# Patient Record
Sex: Female | Born: 1955 | Race: Black or African American | Hispanic: No | Marital: Single | Smoking: Never smoker
Health system: Southern US, Community
[De-identification: ages and names within clinical notes are randomized; demographics above are authoritative.]

## PROBLEM LIST (undated history)

## (undated) DIAGNOSIS — J45909 Unspecified asthma, uncomplicated: Secondary | ICD-10-CM

## (undated) DIAGNOSIS — E785 Hyperlipidemia, unspecified: Secondary | ICD-10-CM

## (undated) DIAGNOSIS — E119 Type 2 diabetes mellitus without complications: Secondary | ICD-10-CM

## (undated) DIAGNOSIS — K219 Gastro-esophageal reflux disease without esophagitis: Secondary | ICD-10-CM

## (undated) HISTORY — DX: Unspecified asthma, uncomplicated: J45.909

## (undated) HISTORY — DX: Hyperlipidemia, unspecified: E78.5

## (undated) HISTORY — DX: Gastro-esophageal reflux disease without esophagitis: K21.9

## (undated) HISTORY — PX: ABDOMINAL HYSTERECTOMY: SHX81

---

## 2016-07-12 ENCOUNTER — Other Ambulatory Visit (HOSPITAL_COMMUNITY): Payer: Self-pay | Admitting: Emergency Medicine

## 2016-07-12 ENCOUNTER — Ambulatory Visit (HOSPITAL_COMMUNITY)
Admission: RE | Admit: 2016-07-12 | Discharge: 2016-07-12 | Disposition: A | Discharge status: Home / Self Care | Payer: Medicare Other | Source: Ambulatory Visit | Attending: Emergency Medicine | Admitting: Emergency Medicine

## 2016-07-12 DIAGNOSIS — M79605 Pain in left leg: Secondary | ICD-10-CM

## 2016-07-12 DIAGNOSIS — M79662 Pain in left lower leg: Secondary | ICD-10-CM | POA: Insufficient documentation

## 2017-04-11 ENCOUNTER — Emergency Department (HOSPITAL_COMMUNITY): Payer: Medicare Other

## 2017-04-11 ENCOUNTER — Emergency Department (HOSPITAL_COMMUNITY)
Admission: EM | Admit: 2017-04-11 | Discharge: 2017-04-11 | Disposition: A | Discharge status: Home / Self Care | Payer: Medicare Other | Attending: Emergency Medicine | Admitting: Emergency Medicine

## 2017-04-11 ENCOUNTER — Encounter (HOSPITAL_COMMUNITY): Payer: Self-pay | Admitting: *Deleted

## 2017-04-11 ENCOUNTER — Other Ambulatory Visit: Payer: Self-pay

## 2017-04-11 DIAGNOSIS — D849 Immunodeficiency, unspecified: Secondary | ICD-10-CM | POA: Diagnosis not present

## 2017-04-11 DIAGNOSIS — E860 Dehydration: Secondary | ICD-10-CM | POA: Diagnosis not present

## 2017-04-11 DIAGNOSIS — R112 Nausea with vomiting, unspecified: Secondary | ICD-10-CM | POA: Insufficient documentation

## 2017-04-11 DIAGNOSIS — R197 Diarrhea, unspecified: Secondary | ICD-10-CM | POA: Diagnosis not present

## 2017-04-11 DIAGNOSIS — N289 Disorder of kidney and ureter, unspecified: Secondary | ICD-10-CM | POA: Diagnosis not present

## 2017-04-11 DIAGNOSIS — E119 Type 2 diabetes mellitus without complications: Secondary | ICD-10-CM | POA: Insufficient documentation

## 2017-04-11 DIAGNOSIS — E876 Hypokalemia: Secondary | ICD-10-CM | POA: Insufficient documentation

## 2017-04-11 HISTORY — DX: Type 2 diabetes mellitus without complications: E11.9

## 2017-04-11 LAB — URINALYSIS, ROUTINE W REFLEX MICROSCOPIC
Bilirubin Urine: NEGATIVE
Glucose, UA: 50 mg/dL — AB
Ketones, ur: NEGATIVE mg/dL
NITRITE: NEGATIVE
Protein, ur: 30 mg/dL — AB
SPECIFIC GRAVITY, URINE: 1.01 (ref 1.005–1.030)
pH: 5 (ref 5.0–8.0)

## 2017-04-11 LAB — CBC WITH DIFFERENTIAL/PLATELET
BASOS PCT: 0 %
Basophils Absolute: 0 10*3/uL (ref 0.0–0.1)
EOS ABS: 0 10*3/uL (ref 0.0–0.7)
Eosinophils Relative: 0 %
HCT: 38 % (ref 36.0–46.0)
HEMOGLOBIN: 12.8 g/dL (ref 12.0–15.0)
Lymphocytes Relative: 14 %
Lymphs Abs: 1.3 10*3/uL (ref 0.7–4.0)
MCH: 28.6 pg (ref 26.0–34.0)
MCHC: 33.7 g/dL (ref 30.0–36.0)
MCV: 85 fL (ref 78.0–100.0)
Monocytes Absolute: 1.4 10*3/uL — ABNORMAL HIGH (ref 0.1–1.0)
Monocytes Relative: 15 %
Neutro Abs: 6.5 10*3/uL (ref 1.7–7.7)
Neutrophils Relative %: 71 %
Platelets: 174 10*3/uL (ref 150–400)
RBC: 4.47 MIL/uL (ref 3.87–5.11)
RDW: 12.5 % (ref 11.5–15.5)
WBC: 9.2 10*3/uL (ref 4.0–10.5)

## 2017-04-11 LAB — COMPREHENSIVE METABOLIC PANEL
ALK PHOS: 57 U/L (ref 38–126)
ALT: 11 U/L — AB (ref 14–54)
AST: 13 U/L — ABNORMAL LOW (ref 15–41)
Albumin: 3.9 g/dL (ref 3.5–5.0)
Anion gap: 16 — ABNORMAL HIGH (ref 5–15)
BUN: 35 mg/dL — ABNORMAL HIGH (ref 6–20)
CALCIUM: 9.5 mg/dL (ref 8.9–10.3)
CO2: 25 mmol/L (ref 22–32)
CREATININE: 2.05 mg/dL — AB (ref 0.44–1.00)
Chloride: 94 mmol/L — ABNORMAL LOW (ref 101–111)
GFR, EST AFRICAN AMERICAN: 29 mL/min — AB (ref >60)
GFR, EST NON AFRICAN AMERICAN: 25 mL/min — AB (ref >60)
Glucose, Bld: 219 mg/dL — ABNORMAL HIGH (ref 65–99)
Potassium: 3.2 mmol/L — ABNORMAL LOW (ref 3.5–5.1)
Sodium: 135 mmol/L (ref 135–145)
Total Bilirubin: 1.3 mg/dL — ABNORMAL HIGH (ref 0.3–1.2)
Total Protein: 8.1 g/dL (ref 6.5–8.1)

## 2017-04-11 LAB — LIPASE, BLOOD: LIPASE: 25 U/L (ref 11–51)

## 2017-04-11 LAB — BASIC METABOLIC PANEL
ANION GAP: 12 (ref 5–15)
BUN: 31 mg/dL — ABNORMAL HIGH (ref 6–20)
CALCIUM: 8.5 mg/dL — AB (ref 8.9–10.3)
CO2: 25 mmol/L (ref 22–32)
CREATININE: 1.58 mg/dL — AB (ref 0.44–1.00)
Chloride: 94 mmol/L — ABNORMAL LOW (ref 101–111)
GFR, EST AFRICAN AMERICAN: 40 mL/min — AB (ref >60)
GFR, EST NON AFRICAN AMERICAN: 34 mL/min — AB (ref >60)
GLUCOSE: 160 mg/dL — AB (ref 65–99)
Potassium: 3.4 mmol/L — ABNORMAL LOW (ref 3.5–5.1)
Sodium: 131 mmol/L — ABNORMAL LOW (ref 135–145)

## 2017-04-11 LAB — TROPONIN I

## 2017-04-11 MED ORDER — METOCLOPRAMIDE HCL 10 MG PO TABS: 5.0000 mg | ORAL_TABLET | Freq: Four times a day (QID) | ORAL | 0 refills | Status: DC | PRN

## 2017-04-11 MED ORDER — POTASSIUM CHLORIDE CRYS ER 20 MEQ PO TBCR
40.0000 meq | EXTENDED_RELEASE_TABLET | Freq: Once | ORAL | Status: AC
  Administered 2017-04-11: 40 meq via ORAL

## 2017-04-11 MED ORDER — SODIUM CHLORIDE 0.9 % IV BOLUS (SEPSIS)
2000.0000 mL | Freq: Once | INTRAVENOUS | Status: AC
  Administered 2017-04-11: 2000 mL via INTRAVENOUS

## 2017-04-11 NOTE — ED Notes (Signed)
NT at bedside drawing BMP

## 2017-04-11 NOTE — Discharge Instructions (Signed)
Take the medication prescribed as needed for nausea.  Avoid milk or foods containing milk such as cheese or ice cream while having diarrhea .take Imodium as directed for diarrhea.Make sure that you drink at least six 8 ounce glasses of water each day in order to stay well-hydrated.  You have a possible abnormality on your right kidney seen on today's CAT scan.  Ask your doctor at the [facility] order a renal ultrasound study to check for cancer within the next 1 or 2 months.  See your doctor if not feeling better in 3 or 4 days.  Return if concerned for any reason or if you are unable to hold down fluids without vomiting

## 2017-04-11 NOTE — ED Provider Notes (Addendum)
[facility] EMERGENCY DEPARTMENT Provider Note   CSN: [id number] Arrival date & time: 04/11/17  1406   level5 caveat pt mentally handicapped.  History is obtained from patient and from patient's guardian who accompanies her  History   Chief Complaint Chief Complaint  Patient presents with  . Abdominal Pain    HPI Martha Jennings is a 62 y.o. female.  HPI Patient with diffuse intermittent abdominal pain onset 5 days ago pain lasts approximately 2 hours of the time.  Symptoms accompanied by vomiting diarrhea.  She reports 2 episodes of diarrhea yesterday and one episode of diarrhea today.  Diarrhea is watery.  She had one episode of vomiting last night.  She is not nauseated at present.  No fever.  Associated symptoms include mild lightheadedness no other associated symptoms.  No treatment prior to coming here Past Medical History:  Diagnosis Date  . Diabetes mellitus without complication ([place])     There are no active problems to display for this patient.   Past Surgical History:  Procedure Laterality Date  . ABDOMINAL HYSTERECTOMY      OB History    No data available       Home Medications    Prior to Admission medications   Not on File    Family History No family history on file.  Social History Social History   Tobacco Use  . Smoking status: Never Smoker  . Smokeless tobacco: Never Used  Substance Use Topics  . Alcohol use: No    Frequency: Never  . Drug use: No     Allergies   Patient has no allergy information on record.   Review of Systems Review of Systems  Unable to perform ROS: Other  Gastrointestinal: Positive for abdominal pain, diarrhea and vomiting.  Allergic/Immunologic: Positive for immunocompromised state.       Diabetic  Patient mentally challenged   Physical Exam Updated Vital Signs BP (!) 79/65 (BP Location: Right Arm)   Pulse 93   Temp 98.1 F (36.7 C) (Oral)   Resp 16   Ht 5\' 4"  (1.626 m)   Wt 83.9 kg (185 lb)   SpO2  98%   BMI 31.76 kg/m   Physical Exam  Constitutional: She appears well-developed and well-nourished.  HENT:  Head: Normocephalic and atraumatic.  mucus membranes dry  Eyes: Conjunctivae are normal. Pupils are equal, round, and reactive to light.  Neck: Neck supple. No tracheal deviation present. No thyromegaly present.  Cardiovascular: Normal rate and regular rhythm.  No murmur heard. Pulmonary/Chest: Effort normal and breath sounds normal.  Abdominal: Soft. Bowel sounds are normal. She exhibits no distension. There is no tenderness.  Musculoskeletal: Normal range of motion. She exhibits no edema or tenderness.  Neurological: She is alert. Coordination normal.  Skin: Skin is warm and dry. Capillary refill takes less than 2 seconds. No rash noted.  Psychiatric: She has a normal mood and affect.  Nursing note and vitals reviewed.  Results for orders placed or performed during the hospital encounter of 04/11/17  Urinalysis, Routine w reflex microscopic  Result Value Ref Range   Color, Urine YELLOW YELLOW   APPearance CLOUDY (A) CLEAR   Specific Gravity, Urine 1.010 1.005 - 1.030   pH 5.0 5.0 - 8.0   Glucose, UA 50 (A) NEGATIVE mg/dL   Hgb urine dipstick MODERATE (A) NEGATIVE   Bilirubin Urine NEGATIVE NEGATIVE   Ketones, ur NEGATIVE NEGATIVE mg/dL   Protein, ur 30 (A) NEGATIVE mg/dL   Nitrite NEGATIVE NEGATIVE  Leukocytes, UA LARGE (A) NEGATIVE   RBC / HPF 6-30 0 - 5 RBC/hpf   WBC, UA TOO NUMEROUS TO COUNT 0 - 5 WBC/hpf   Bacteria, UA MANY (A) NONE SEEN   Squamous Epithelial / LPF 0-5 (A) NONE SEEN   WBC Clumps PRESENT    Mucus PRESENT    Hyaline Casts, UA PRESENT   Comprehensive metabolic panel  Result Value Ref Range   Sodium 135 135 - 145 mmol/L   Potassium 3.2 (L) 3.5 - 5.1 mmol/L   Chloride 94 (L) 101 - 111 mmol/L   CO2 25 22 - 32 mmol/L   Glucose, Bld 219 (H) 65 - 99 mg/dL   BUN 35 (H) 6 - 20 mg/dL   Creatinine, Ser 2.05 (H) 0.44 - 1.00 mg/dL   Calcium 9.5 8.9 -  10.3 mg/dL   Total Protein 8.1 6.5 - 8.1 g/dL   Albumin 3.9 3.5 - 5.0 g/dL   AST 13 (L) 15 - 41 U/L   ALT 11 (L) 14 - 54 U/L   Alkaline Phosphatase 57 38 - 126 U/L   Total Bilirubin 1.3 (H) 0.3 - 1.2 mg/dL   GFR calc non Af Amer 25 (L) >60 mL/min   GFR calc Af Amer 29 (L) >60 mL/min   Anion gap 16 (H) 5 - 15  CBC with Differential/Platelet  Result Value Ref Range   WBC 9.2 4.0 - 10.5 K/uL   RBC 4.47 3.87 - 5.11 MIL/uL   Hemoglobin 12.8 12.0 - 15.0 g/dL   HCT 38.0 36.0 - 46.0 %   MCV 85.0 78.0 - 100.0 fL   MCH 28.6 26.0 - 34.0 pg   MCHC 33.7 30.0 - 36.0 g/dL   RDW 12.5 11.5 - 15.5 %   Platelets 174 150 - 400 K/uL   Neutrophils Relative % 71 %   Neutro Abs 6.5 1.7 - 7.7 K/uL   Lymphocytes Relative 14 %   Lymphs Abs 1.3 0.7 - 4.0 K/uL   Monocytes Relative 15 %   Monocytes Absolute 1.4 (H) 0.1 - 1.0 K/uL   Eosinophils Relative 0 %   Eosinophils Absolute 0.0 0.0 - 0.7 K/uL   Basophils Relative 0 %   Basophils Absolute 0.0 0.0 - 0.1 K/uL  Troponin I  Result Value Ref Range   Troponin I <0.03 <0.03 ng/mL  Lipase, blood  Result Value Ref Range   Lipase 25 11 - 51 U/L  Basic metabolic panel  Result Value Ref Range   Sodium 131 (L) 135 - 145 mmol/L   Potassium 3.4 (L) 3.5 - 5.1 mmol/L   Chloride 94 (L) 101 - 111 mmol/L   CO2 25 22 - 32 mmol/L   Glucose, Bld 160 (H) 65 - 99 mg/dL   BUN 31 (H) 6 - 20 mg/dL   Creatinine, Ser 1.58 (H) 0.44 - 1.00 mg/dL   Calcium 8.5 (L) 8.9 - 10.3 mg/dL   GFR calc non Af Amer 34 (L) >60 mL/min   GFR calc Af Amer 40 (L) >60 mL/min   Anion gap 12 5 - 15   Ct Abdomen Pelvis Wo Contrast  Result Date: 04/11/2017 CLINICAL DATA:  Nausea and vomiting EXAM: CT ABDOMEN AND PELVIS WITHOUT CONTRAST TECHNIQUE: Multidetector CT imaging of the abdomen and pelvis was performed following the standard protocol without IV contrast. COMPARISON:  None. FINDINGS: Lower chest: Lung bases demonstrate linear scarring or atelectasis at the right base. No focal  consolidation or pleural effusion. Heart size within normal limits. Hepatobiliary:  Surgical clips at the gallbladder fossa. No focal hepatic abnormality. Prominent extrahepatic bile duct likely due to surgical changes, this measures up to 13 mm on coronal views. Pancreas: Unremarkable. No pancreatic ductal dilatation or surrounding inflammatory changes. Spleen: Normal in size without focal abnormality. Adrenals/Urinary Tract: Adrenal glands are within normal limits. No hydronephrosis. Lobular contour deformity of the mid right kidney, best seen on coronal views, series 5, image number 58. Bladder is unremarkable Stomach/Bowel: Stomach is within normal limits. Appendix appears normal. No evidence of bowel wall thickening, distention, or inflammatory changes. Vascular/Lymphatic: Mild aortic atherosclerosis. No aneurysmal dilatation. Scattered subcentimeter retroperitoneal lymph nodes. Reproductive: Status post hysterectomy. No adnexal masses. Other: Negative for free air or free fluid. Small fat in the umbilicus Musculoskeletal: Degenerative changes. No acute or suspicious bone lesion. IMPRESSION: 1. No CT evidence for acute intra-abdominal or pelvic abnormality. Negative for bowel obstruction or bowel wall thickening 2. Possible lobulated contour deformity of the right kidney, not sure if this is normal parenchyma or due to a mass; suggest correlation with renal ultrasound 3. Prominent extrahepatic bile duct, likely due to surgical changes. Suggest correlation with LFTs. Electronically Signed   By: Donavan Foil M.D.   On: 04/11/2017 19:34    ED Treatments / Results  Labs (all labs ordered are listed, but only abnormal results are displayed) Labs Reviewed - No data to display  EKG  EKG Interpretation  Date/Time:  Monday April 11 2017 16:35:02 EST Ventricular Rate:  63 PR Interval:    QRS Duration: 100 QT Interval:  385 QTC Calculation: 395 R Axis:   -18 Text Interpretation:  Sinus rhythm  Borderline left axis deviation Consider anterior infarct No old tracing to compare Confirmed by [place], Inocente Salles [phone]) on 04/11/2017 4:45:57 PM       Radiology No results found.  Procedures Procedures (including critical care time)  Medications Ordered in ED Medications - No data to display   Initial Impression / Assessment and Plan / ED Course  I have reviewed the triage vital signs and the nursing notes.  Pertinent labs & imaging results that were available during my care of the patient were reviewed by me and considered in my medical decision making (see chart for details).     10:05 PM patient feels improved after treatment with intravenous fluids.  Able to drink without nausea or vomiting. Mild renal insufficiency has improved after treatment with intravenous hydration.  Prescription Reglan Plan avoid dairy.  Imodium for diarrhea.  Encourage oral hydration.  Follow-up with PMD .received oral potassium supplementation while here. Of note patient denies urinary symptoms.  Urine sent for culture.. Instructed patient's guardian about need for follow-up renal ultrasound Final Clinical Impressions(s) / ED Diagnoses  Dx #1 nausea vomiting diarrhea #2 mild dehydration #3 renal insufficiency #4 hypokalemia Final diagnoses:  None    ED Discharge Orders    None       Orlie Dakin, MD 04/11/17 2218    Orlie Dakin, MD 04/11/17 2224

## 2017-04-11 NOTE — ED Notes (Signed)
Pt to CT

## 2017-04-11 NOTE — ED Notes (Signed)
Pt ambulated to the bathroom without any problem

## 2017-04-14 LAB — URINE CULTURE: Culture: >=100000 — AB

## 2017-04-15 ENCOUNTER — Telehealth: Payer: Self-pay | Admitting: Emergency Medicine

## 2017-04-15 NOTE — Progress Notes (Signed)
ED Antimicrobial Stewardship Positive Culture Follow Up   Martha Jennings is an 62 y.o. female who presented to [facility] on 04/11/2017 with a chief complaint of  Chief Complaint  Patient presents with  . Abdominal Pain    Recent Results (from the past 720 hour(s))  Urine Culture     Status: Abnormal   Collection Time: 04/11/17  4:25 PM  Result Value Ref Range Status   Specimen Description URINE, RANDOM  Final   Special Requests Immunocompromised  Final   Culture >=100,000 COLONIES/mL ESCHERICHIA COLI (A)  Final   Report Status 04/14/2017 FINAL  Final   Organism ID, Bacteria ESCHERICHIA COLI (A)  Final      Susceptibility   Escherichia coli - MIC*    AMPICILLIN 8 SENSITIVE Sensitive     CEFAZOLIN <=4 SENSITIVE Sensitive     CEFTRIAXONE <=1 SENSITIVE Sensitive     CIPROFLOXACIN <=0.25 SENSITIVE Sensitive     GENTAMICIN <=1 SENSITIVE Sensitive     IMIPENEM <=0.25 SENSITIVE Sensitive     NITROFURANTOIN <=16 SENSITIVE Sensitive     TRIMETH/SULFA <=20 SENSITIVE Sensitive     AMPICILLIN/SULBACTAM 4 SENSITIVE Sensitive     PIP/TAZO <=4 SENSITIVE Sensitive     Extended ESBL NEGATIVE Sensitive     * >=100,000 COLONIES/mL ESCHERICHIA COLI    Asymptomatic bacteriuria. No treatment necessary  ED Provider: Benedetto Goad PA-C   Reginia Naas 04/15/2017, 9:33 AM Infectious Diseases Pharmacist Phone# [phone]

## 2017-04-15 NOTE — Telephone Encounter (Signed)
Post ED Visit - Positive Culture Follow-up  Culture report reviewed by antimicrobial stewardship pharmacist:  [x]  Elenor Quinones, Pharm.D. []  Heide Guile, Pharm.D., BCPS AQ-ID []  Parks Neptune, Pharm.D., BCPS []  Alycia Rossetti, Pharm.D., BCPS []  [place], [place].D., BCPS, AAHIVP []  Legrand Como, Pharm.D., BCPS, AAHIVP []  Salome Arnt, PharmD, BCPS []  Jalene Mullet, PharmD []  Vincenza Hews, PharmD, BCPS  Positive urine culture Treated with none,a symptomatic,no further patient follow-up is required at this time.  Hazle Nordmann 04/15/2017, 9:53 AM

## 2017-06-03 IMAGING — US US EXTREM LOW VENOUS*L*
1 series · 13 of 24 positions shown · non-contrast
Comparison: None.

CLINICAL DATA: Left lower extremity pain extending from the thigh
to the calf for a couple of months.



[Series 1: us extrem low venous*left* · 0.06mm/px · 13 of 57 slices shown]
[im 1/57]
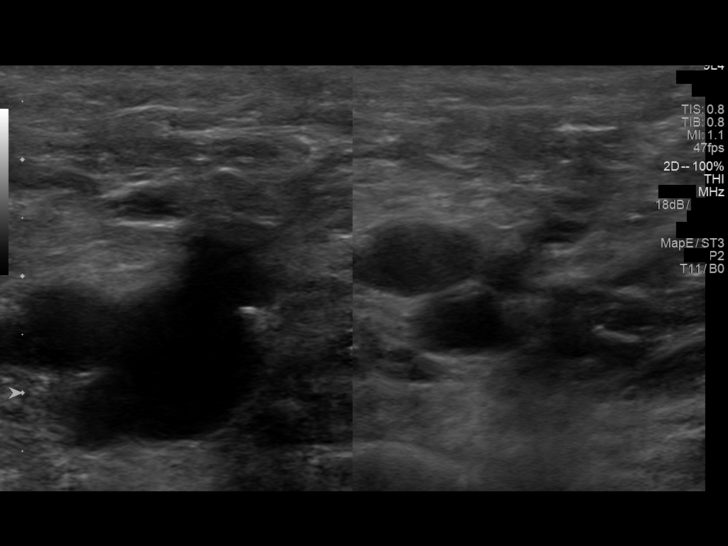
[im 5/57]
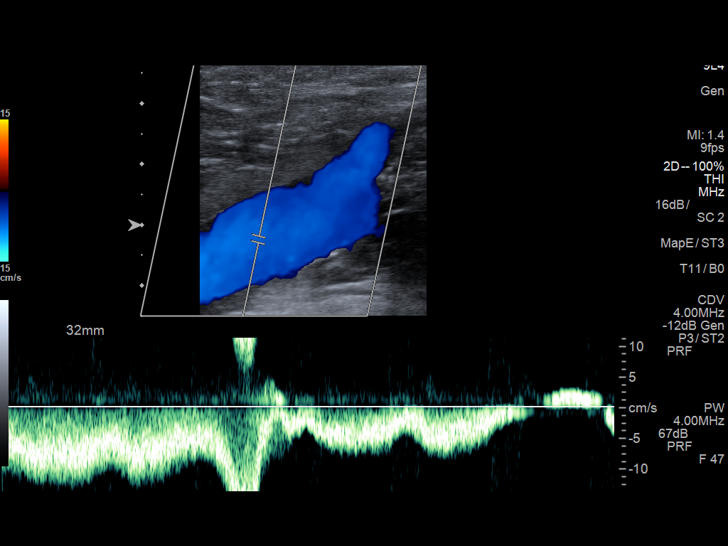
[im 10/57]
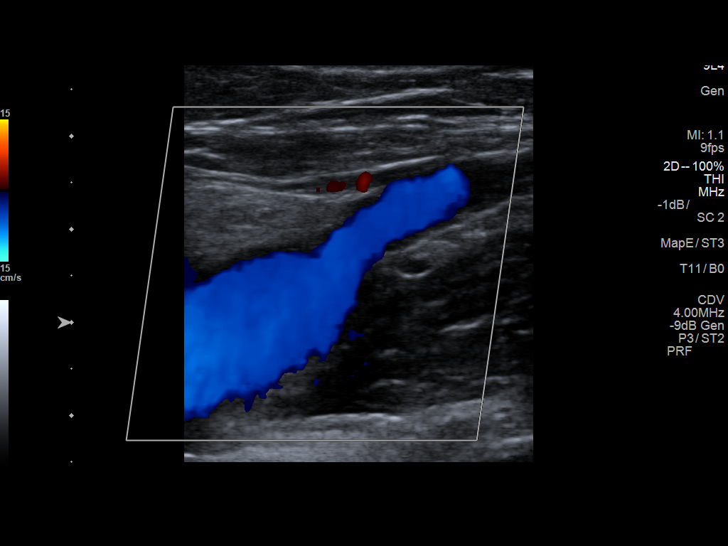
[im 15/57]
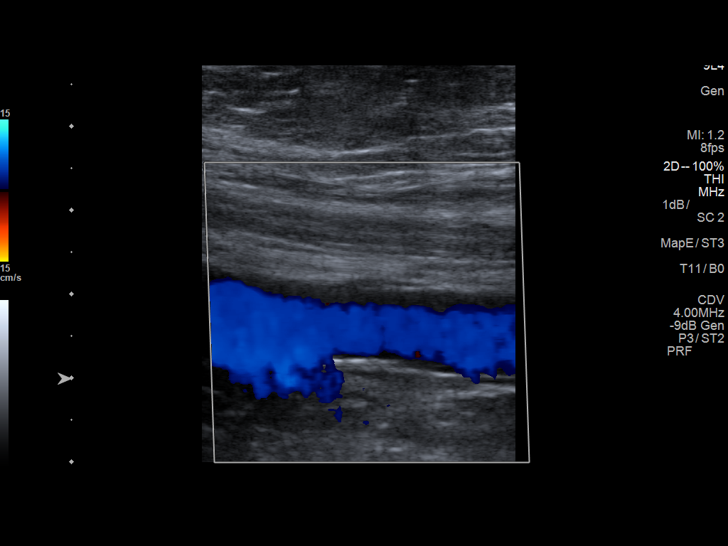
[im 20/57]
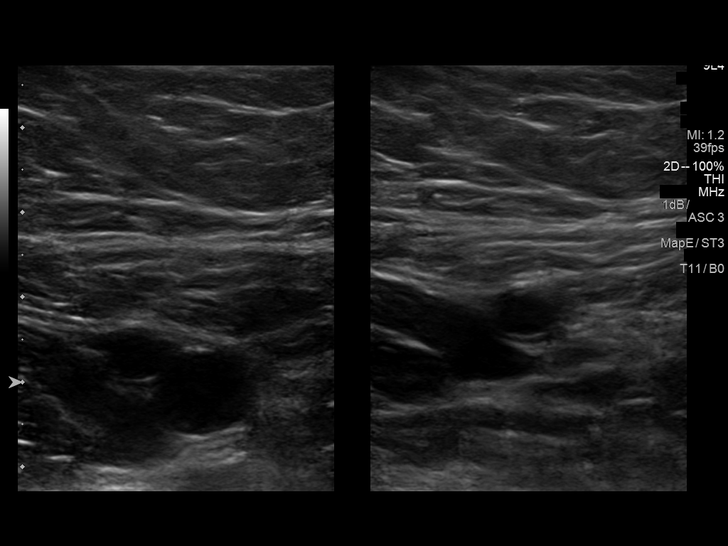
[im 25/57]
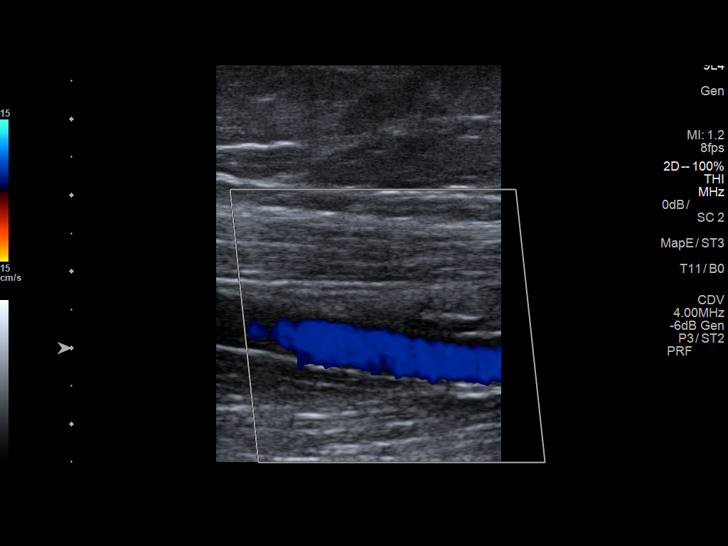
[im 30/57]
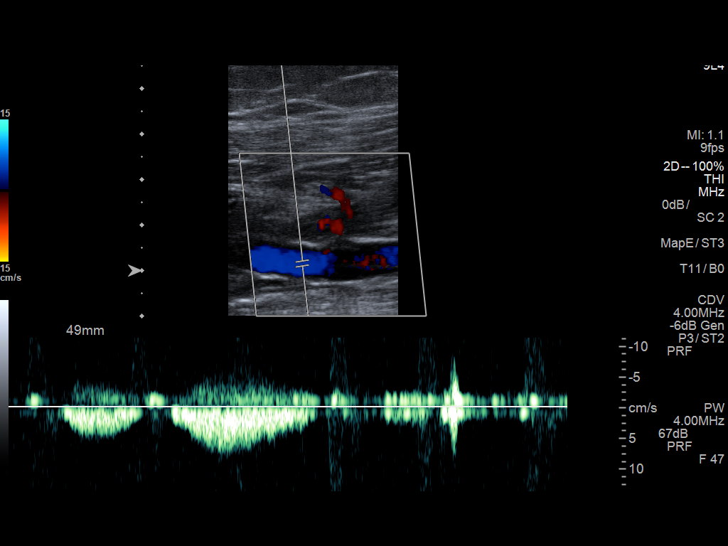
[im 32/57]
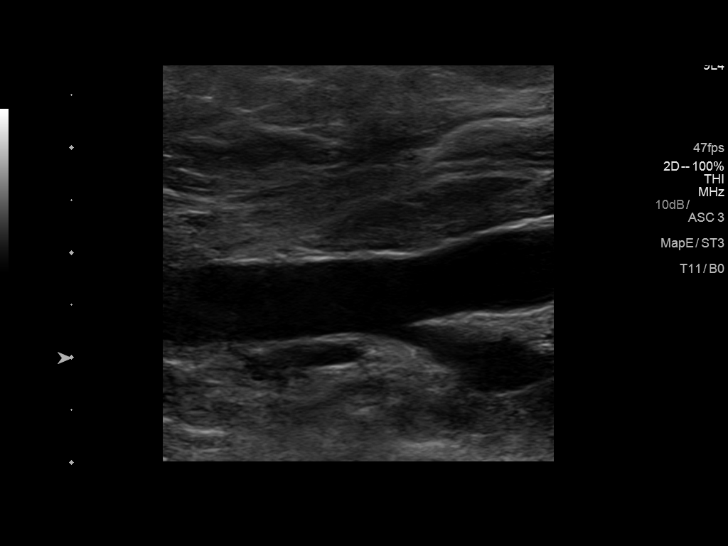
[im 37/57]
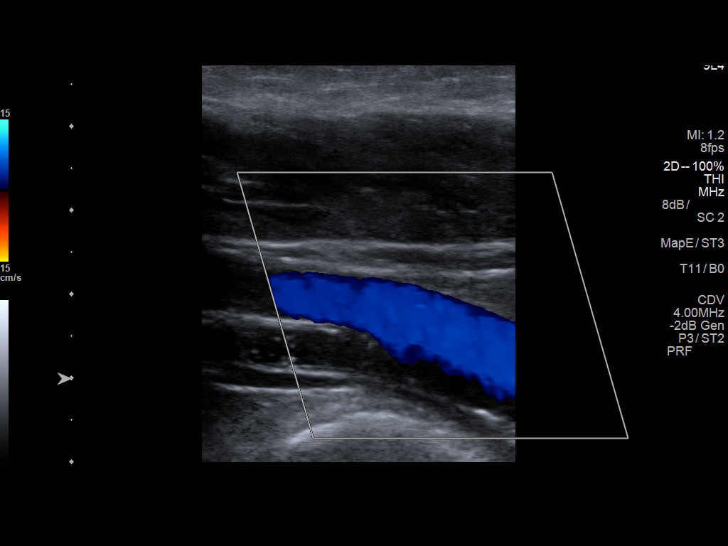
[im 42/57]
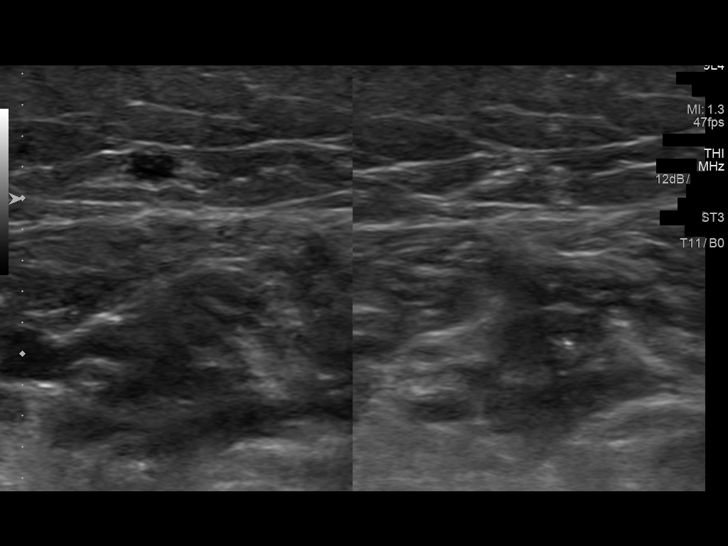
[im 47/57]
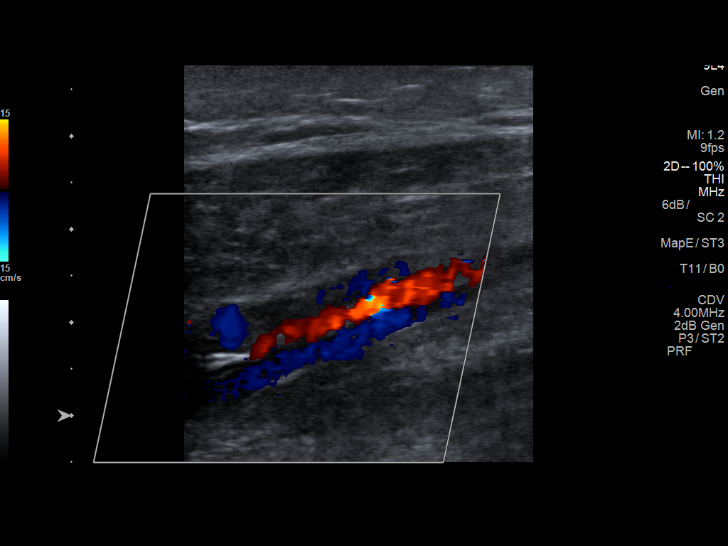
[im 52/57]
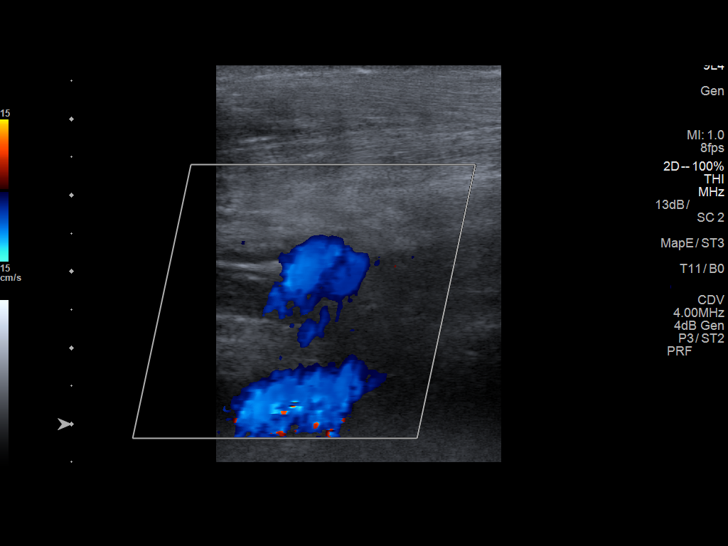
[im 57/57]
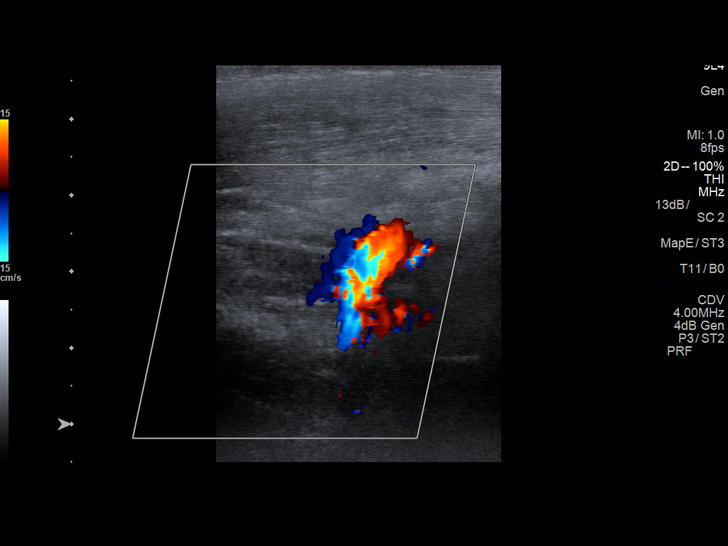

[13 of 24 positions shown; findings below may reference images not displayed]

FINDINGS: Contralateral Common Femoral Vein: Respiratory phasicity is normal
and symmetric with the symptomatic side. No evidence of thrombus.
Normal compressibility.

Common Femoral Vein: No evidence of thrombus. Normal
compressibility, respiratory phasicity and response to augmentation.

Saphenofemoral Junction: No evidence of thrombus. Normal
compressibility and flow on color Doppler imaging.

Profunda Femoral Vein: No evidence of thrombus. Normal
compressibility and flow on color Doppler imaging.

Femoral Vein: No evidence of thrombus. Normal compressibility,
respiratory phasicity and response to augmentation.

Popliteal Vein: No evidence of thrombus. Normal compressibility,
respiratory phasicity and response to augmentation.

Calf Veins: No evidence of thrombus. Normal compressibility and flow
on color Doppler imaging.

Superficial Great Saphenous Vein: No evidence of thrombus. Normal
compressibility and flow on color Doppler imaging.

Venous Reflux:  None.

Other Findings:  Incidental superficial varicosities in the calf.
IMPRESSION: No evidence of deep venous thrombosis.

## 2019-02-05 IMAGING — CT CT ABD-PELV W/O CM
2 of 4 series · 15 of 46 positions shown, 17 images · non-contrast
Comparison: None.

CLINICAL DATA: Nausea and vomiting

EXAM:
CT ABDOMEN AND PELVIS WITHOUT CONTRAST
TECHNIQUE: Multidetector CT imaging of the abdomen and pelvis was performed
following the standard protocol without IV contrast.

[Series 2: axial st · axial · 0.72mm/px · z∈[+947,+1412]mm · 12 of 103 slices shown, 14 images]
[im 5/103  soft-tissue]
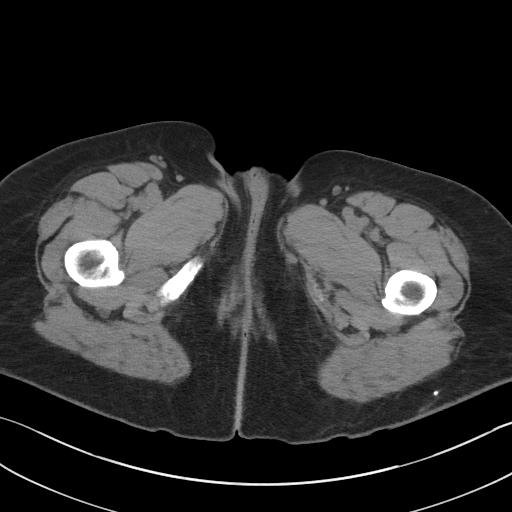
[im 5/103  bone]
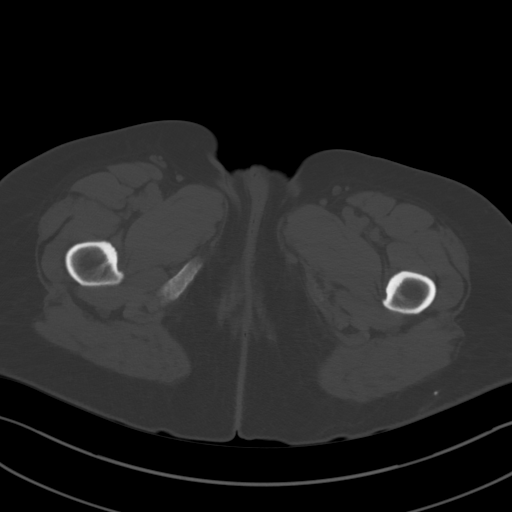
[im 13/103  soft-tissue]
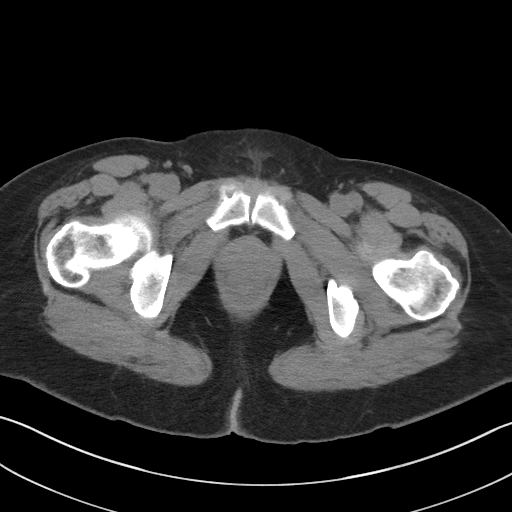
[im 22/103  soft-tissue]
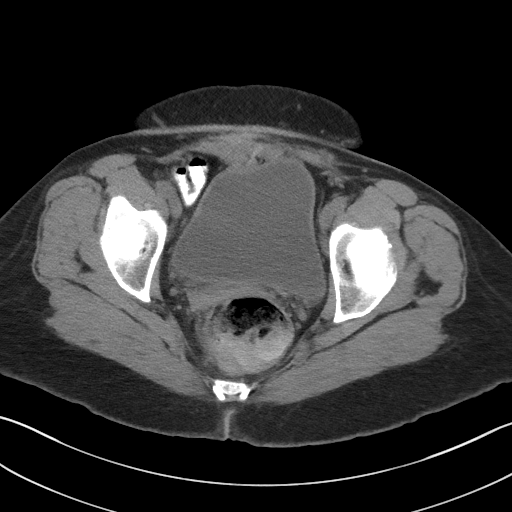
[im 30/103  soft-tissue]
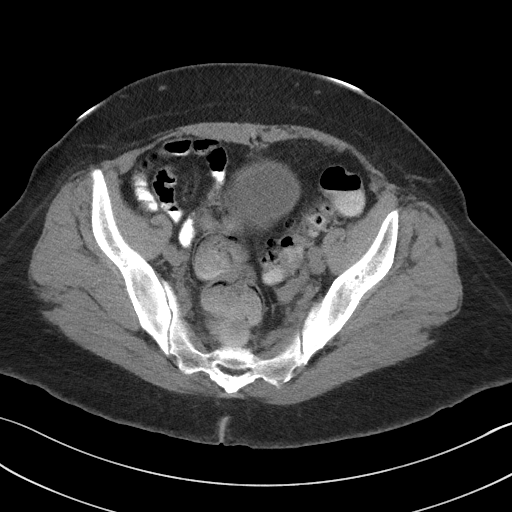
[im 39/103  soft-tissue]
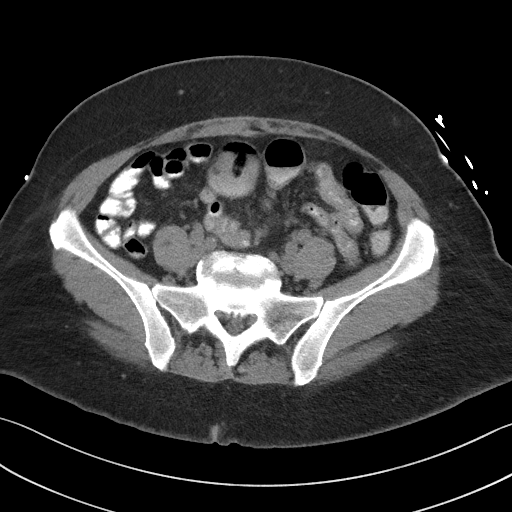
[im 47/103  soft-tissue]
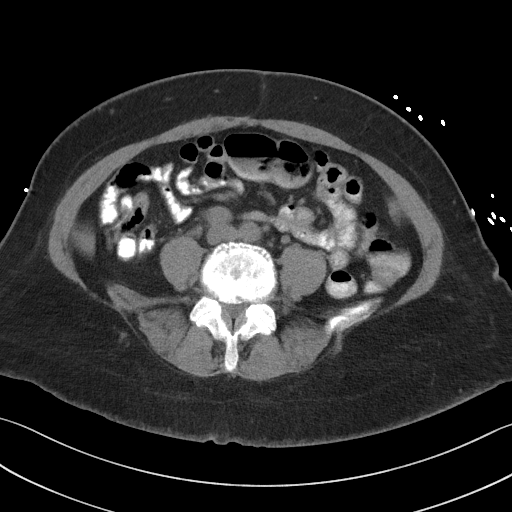
[im 56/103  soft-tissue]
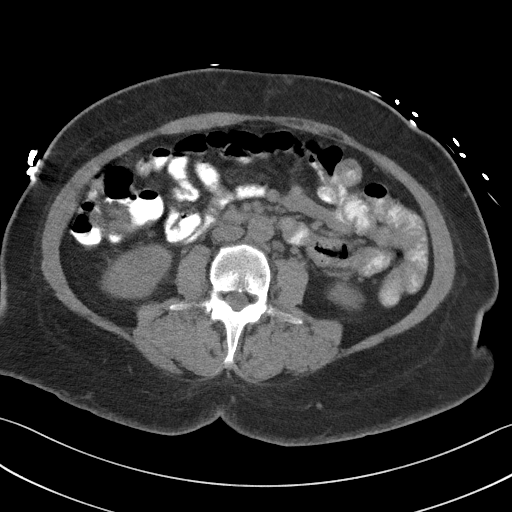
[im 64/103  soft-tissue]
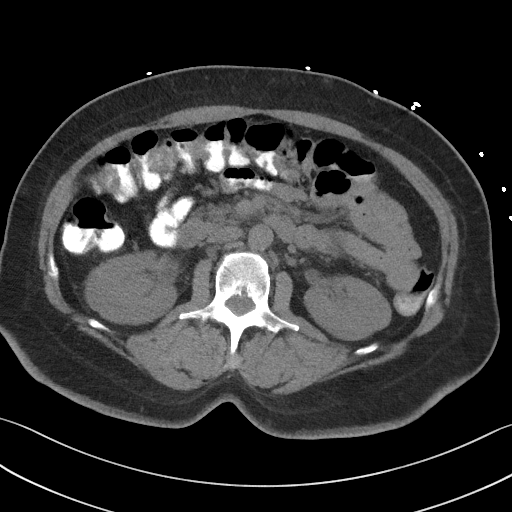
[im 73/103  soft-tissue]
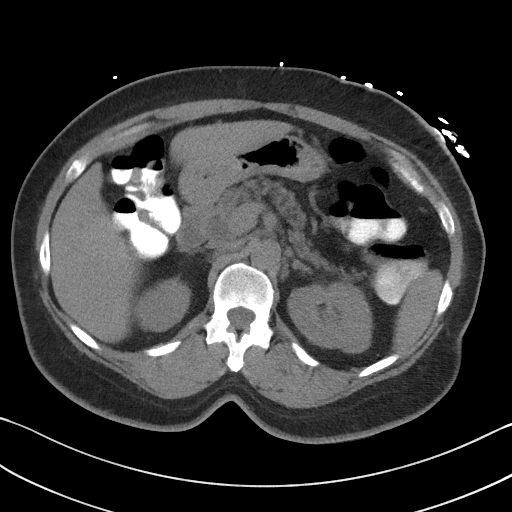
[im 73/103  bone]
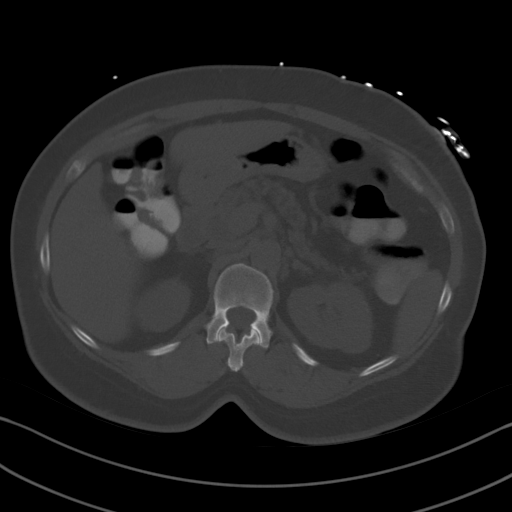
[im 81/103  soft-tissue]
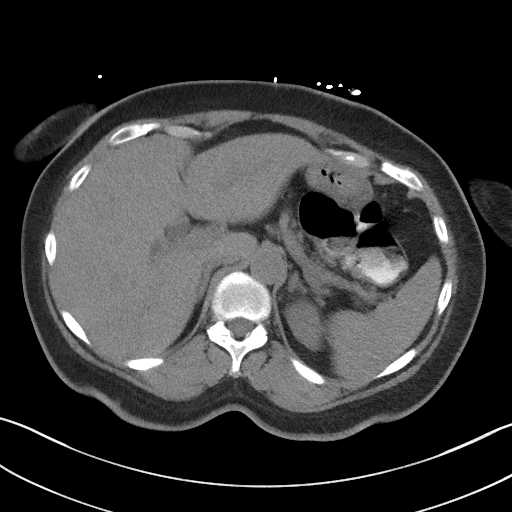
[im 90/103  soft-tissue]
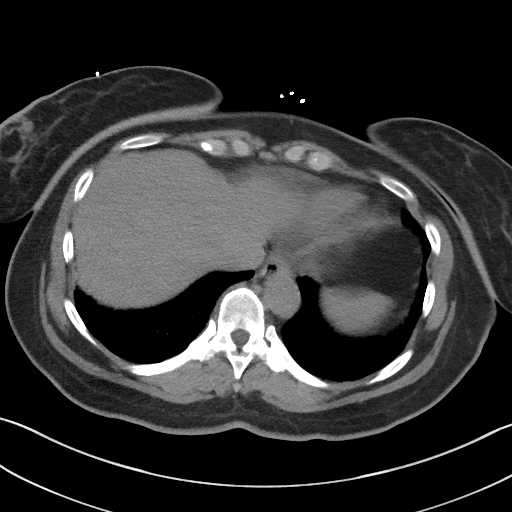
[im 98/103  soft-tissue]
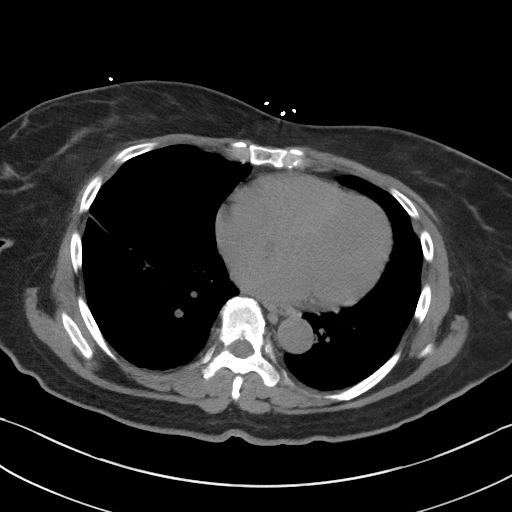

[Series 5: coronal st · coronal · 0.70mm/px · 3 of 86 slices shown]
[im 29/86  soft-tissue]
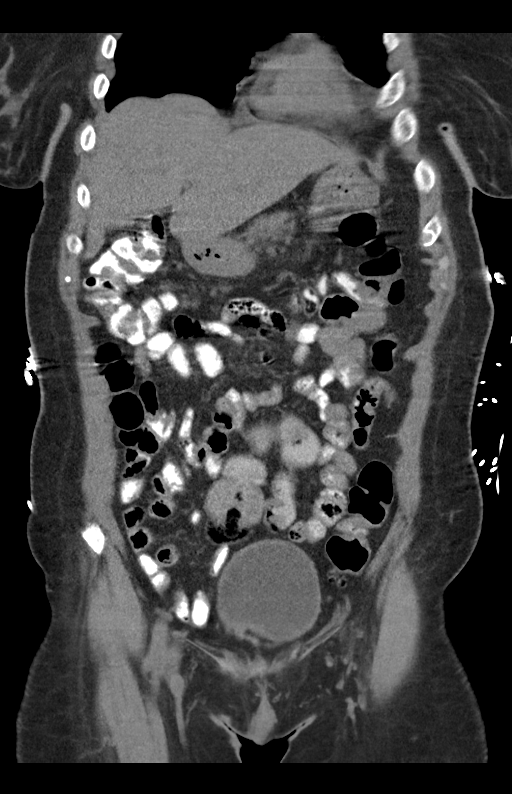
[im 38/86  soft-tissue]
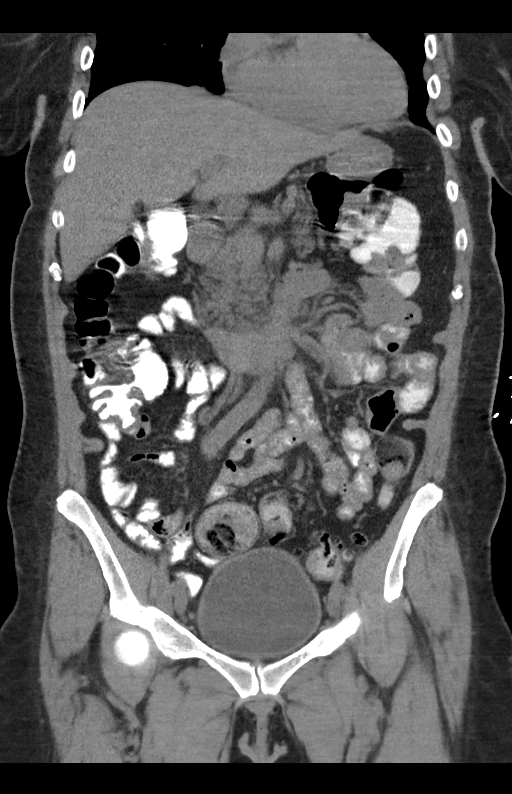
[im 48/86  soft-tissue]
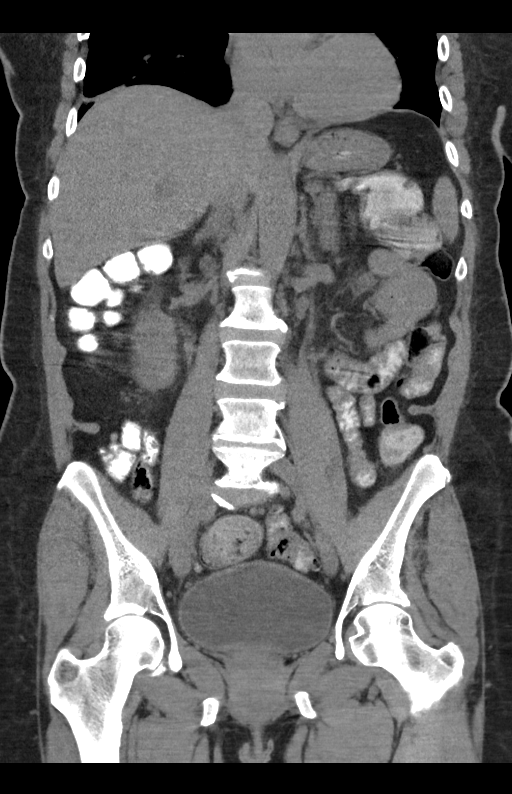

[15 of 46 positions shown; findings below may reference images not displayed]

FINDINGS: Lower chest: Lung bases demonstrate linear scarring or atelectasis
at the right base. No focal consolidation or pleural effusion. Heart
size within normal limits.

Hepatobiliary: Surgical clips at the gallbladder fossa. No focal
hepatic abnormality. Prominent extrahepatic bile duct likely due to
surgical changes, this measures up to 13 mm on coronal views.

Pancreas: Unremarkable. No pancreatic ductal dilatation or
surrounding inflammatory changes.

Spleen: Normal in size without focal abnormality.

Adrenals/Urinary Tract: Adrenal glands are within normal limits. No
hydronephrosis. Lobular contour deformity of the mid right kidney,
best seen on coronal views, series 5, image number 58. Bladder is
unremarkable

Stomach/Bowel: Stomach is within normal limits. Appendix appears
normal. No evidence of bowel wall thickening, distention, or
inflammatory changes.

Vascular/Lymphatic: Mild aortic atherosclerosis. No aneurysmal
dilatation. Scattered subcentimeter retroperitoneal lymph nodes.

Reproductive: Status post hysterectomy. No adnexal masses.

Other: Negative for free air or free fluid. Small fat in the
umbilicus

Musculoskeletal: Degenerative changes. No acute or suspicious bone
lesion.
IMPRESSION: 1. No CT evidence for acute intra-abdominal or pelvic abnormality.
Negative for bowel obstruction or bowel wall thickening
2. Possible lobulated contour deformity of the right kidney, not
sure if this is normal parenchyma or due to a mass; suggest
correlation with renal ultrasound
3. Prominent extrahepatic bile duct, likely due to surgical changes.
Suggest correlation with LFTs.

## 2020-04-28 LAB — LIPID PANEL
LDL Cholesterol: 85
Triglycerides: 137 (ref 40–160)

## 2020-07-31 LAB — BASIC METABOLIC PANEL
BUN: 23 — AB (ref 4–21)
CO2: 29 — AB (ref 13–22)
Chloride: 98 — AB (ref 99–108)
Creatinine: 1.1 (ref 0.5–1.1)
Glucose: 346
Potassium: 4.4 (ref 3.4–5.3)
Sodium: 137 (ref 137–147)

## 2020-07-31 LAB — COMPREHENSIVE METABOLIC PANEL
Albumin: 4.3 (ref 3.5–5.0)
Calcium: 10 (ref 8.7–10.7)
Globulin: 3.1

## 2020-07-31 LAB — HEPATIC FUNCTION PANEL
ALT: 11 (ref 7–35)
AST: 12 — AB (ref 13–35)

## 2020-07-31 LAB — HEMOGLOBIN A1C: Hemoglobin A1C: 12.2

## 2020-08-13 ENCOUNTER — Ambulatory Visit: Payer: Medicare Other | Admitting: Nurse Practitioner

## 2020-09-08 ENCOUNTER — Ambulatory Visit (INDEPENDENT_AMBULATORY_CARE_PROVIDER_SITE_OTHER): Payer: Medicare Other | Admitting: Nurse Practitioner

## 2020-09-08 ENCOUNTER — Other Ambulatory Visit: Payer: Self-pay

## 2020-09-08 ENCOUNTER — Encounter: Payer: Self-pay | Admitting: Nurse Practitioner

## 2020-09-08 VITALS — BP 157/92 | HR 59 | Ht 68.0 in | Wt 209.0 lb

## 2020-09-08 DIAGNOSIS — Z794 Long term (current) use of insulin: Secondary | ICD-10-CM

## 2020-09-08 DIAGNOSIS — E1165 Type 2 diabetes mellitus with hyperglycemia: Secondary | ICD-10-CM | POA: Diagnosis not present

## 2020-09-08 NOTE — Patient Instructions (Signed)
Diabetes Mellitus and Nutrition, Adult When you have diabetes, or diabetes mellitus, it is very important to have healthy eating habits because your blood sugar (glucose) levels are greatly affected by what you eat and drink. Eating healthy foods in the right amounts, at about the same times every day, can help you:  Control your blood glucose.  Lower your risk of heart disease.  Improve your blood pressure.  Reach or maintain a healthy weight. What can affect my meal plan? Every person with diabetes is different, and each person has different needs for a meal plan. Your health care provider may recommend that you work with a dietitian to make a meal plan that is best for you. Your meal plan may vary depending on factors such as:  The calories you need.  The medicines you take.  Your weight.  Your blood glucose, blood pressure, and cholesterol levels.  Your activity level.  Other health conditions you have, such as heart or kidney disease. How do carbohydrates affect me? Carbohydrates, also called carbs, affect your blood glucose level more than any other type of food. Eating carbs naturally raises the amount of glucose in your blood. Carb counting is a method for keeping track of how many carbs you eat. Counting carbs is important to keep your blood glucose at a healthy level, especially if you use insulin or take certain oral diabetes medicines. It is important to know how many carbs you can safely have in each meal. This is different for every person. Your dietitian can help you calculate how many carbs you should have at each meal and for each snack. How does alcohol affect me? Alcohol can cause a sudden decrease in blood glucose (hypoglycemia), especially if you use insulin or take certain oral diabetes medicines. Hypoglycemia can be a life-threatening condition. Symptoms of hypoglycemia, such as sleepiness, dizziness, and confusion, are similar to symptoms of having too much  alcohol.  Do not drink alcohol if: ? Your health care provider tells you not to drink. ? You are pregnant, may be pregnant, or are planning to become pregnant.  If you drink alcohol: ? Do not drink on an empty stomach. ? Limit how much you use to:  0-1 drink a day for women.  0-2 drinks a day for men. ? Be aware of how much alcohol is in your drink. In the [place] one drink equals one 12 oz bottle of beer (355 mL), one 5 oz glass of wine (148 mL), or one 1 oz glass of hard liquor (44 mL). ? Keep yourself hydrated with water, diet soda, or unsweetened iced tea.  Keep in mind that regular soda, juice, and other mixers may contain a lot of sugar and must be counted as carbs. What are tips for following this plan? Reading food labels  Start by checking the serving size on the "Nutrition Facts" label of packaged foods and drinks. The amount of calories, carbs, fats, and other nutrients listed on the label is based on one serving of the item. Many items contain more than one serving per package.  Check the total grams (g) of carbs in one serving. You can calculate the number of servings of carbs in one serving by dividing the total carbs by 15. For example, if a food has 30 g of total carbs per serving, it would be equal to 2 servings of carbs.  Check the number of grams (g) of saturated fats and trans fats in one serving. Choose foods that have   a low amount or none of these fats.  Check the number of milligrams (mg) of salt (sodium) in one serving. Most people should limit total sodium intake to less than 2,300 mg per day.  Always check the nutrition information of foods labeled as "low-fat" or "nonfat." These foods may be higher in added sugar or refined carbs and should be avoided.  Talk to your dietitian to identify your daily goals for nutrients listed on the label. Shopping  Avoid buying canned, pre-made, or processed foods. These foods tend to be high in fat, sodium, and added  sugar.  Shop around the outside edge of the grocery store. This is where you will most often find fresh fruits and vegetables, bulk grains, fresh meats, and fresh dairy. Cooking  Use low-heat cooking methods, such as baking, instead of high-heat cooking methods like deep frying.  Cook using healthy oils, such as olive, canola, or sunflower oil.  Avoid cooking with butter, cream, or high-fat meats. Meal planning  Eat meals and snacks regularly, preferably at the same times every day. Avoid going long periods of time without eating.  Eat foods that are high in fiber, such as fresh fruits, vegetables, beans, and whole grains. Talk with your dietitian about how many servings of carbs you can eat at each meal.  Eat 4-6 oz (112-168 g) of lean protein each day, such as lean meat, chicken, fish, eggs, or tofu. One ounce (oz) of lean protein is equal to: ? 1 oz (28 g) of meat, chicken, or fish. ? 1 egg. ?  cup (62 g) of tofu.  Eat some foods each day that contain healthy fats, such as avocado, nuts, seeds, and fish.   What foods should I eat? Fruits Berries. Apples. Oranges. Peaches. Apricots. Plums. Grapes. Mango. Papaya. Pomegranate. Kiwi. Cherries. Vegetables Lettuce. Spinach. Leafy greens, including kale, chard, collard greens, and mustard greens. Beets. Cauliflower. Cabbage. Broccoli. Carrots. Green beans. Tomatoes. Peppers. Onions. Cucumbers. Brussels sprouts. Grains Whole grains, such as whole-wheat or whole-grain bread, crackers, tortillas, cereal, and pasta. Unsweetened oatmeal. Quinoa. Brown or wild rice. Meats and other proteins Seafood. Poultry without skin. Lean cuts of poultry and beef. Tofu. Nuts. Seeds. Dairy Low-fat or fat-free dairy products such as milk, yogurt, and cheese. The items listed above may not be a complete list of foods and beverages you can eat. Contact a dietitian for more information. What foods should I avoid? Fruits Fruits canned with  syrup. Vegetables Canned vegetables. Frozen vegetables with butter or cream sauce. Grains Refined white flour and flour products such as bread, pasta, snack foods, and cereals. Avoid all processed foods. Meats and other proteins Fatty cuts of meat. Poultry with skin. Breaded or fried meats. Processed meat. Avoid saturated fats. Dairy Full-fat yogurt, cheese, or milk. Beverages Sweetened drinks, such as soda or iced tea. The items listed above may not be a complete list of foods and beverages you should avoid. Contact a dietitian for more information. Questions to ask a health care provider  Do I need to meet with a diabetes educator?  Do I need to meet with a dietitian?  What number can I call if I have questions?  When are the best times to check my blood glucose? Where to find more information:  American Diabetes Association: diabetes.org  Academy of Nutrition and Dietetics: www.eatright.org  National Institute of Diabetes and Digestive and Kidney Diseases: www.niddk.nih.gov  Association of Diabetes Care and Education Specialists: www.diabeteseducator.org Summary  It is important to have healthy eating   habits because your blood sugar (glucose) levels are greatly affected by what you eat and drink.  A healthy meal plan will help you control your blood glucose and maintain a healthy lifestyle.  Your health care provider may recommend that you work with a dietitian to make a meal plan that is best for you.  Keep in mind that carbohydrates (carbs) and alcohol have immediate effects on your blood glucose levels. It is important to count carbs and to use alcohol carefully. This information is not intended to replace advice given to you by your health care provider. Make sure you discuss any questions you have with your health care provider. Document Revised: 02/13/2019 Document Reviewed: 02/13/2019 Elsevier Patient Education  2021 [employer]  

## 2020-09-08 NOTE — Progress Notes (Signed)
Endocrinology Consult Note       09/08/2020, 9:34 AM   Subjective:    Patient ID: Martha Jennings, female    DOB: May 03, 1955.  Martha Jennings is being seen in consultation for management of currently uncontrolled symptomatic diabetes requested by  Vidal Schwalbe, MD.   Past Medical History:  Diagnosis Date   Asthma    Diabetes mellitus without complication ([place])    GERD (gastroesophageal reflux disease)    Hyperlipidemia     Past Surgical History:  Procedure Laterality Date   ABDOMINAL HYSTERECTOMY      Social History   Socioeconomic History   Marital status: Single    Spouse name: Not on file   Number of children: Not on file   Years of education: Not on file   Highest education level: Not on file  Occupational History   Not on file  Tobacco Use   Smoking status: Never   Smokeless tobacco: Never  Vaping Use   Vaping Use: Not on file  Substance and Sexual Activity   Alcohol use: No   Drug use: No   Sexual activity: Not on file  Other Topics Concern   Not on file  Social History Narrative   Not on file   Social Determinants of Health   Financial Resource Strain: Not on file  Food Insecurity: Not on file  Transportation Needs: Not on file  Physical Activity: Not on file  Stress: Not on file  Social Connections: Not on file    Family History  Problem Relation Age of Onset   Cancer Mother     Outpatient Encounter Medications as of 09/08/2020  Medication Sig   amLODipine (NORVASC) 5 MG tablet Take 5 mg by mouth daily.    atorvastatin (LIPITOR) 40 MG tablet Take 40 mg by mouth daily at 6 PM.    cloNIDine (CATAPRES) 0.1 MG tablet Take 0.1 mg by mouth daily.    insulin detemir (LEVEMIR FLEXTOUCH) 100 UNIT/ML FlexPen Inject 30 Units into the skin at bedtime.   metFORMIN (GLUCOPHAGE) 1000 MG tablet Take 1,000 mg by mouth 2 (two) times daily with a meal.    omeprazole (PRILOSEC) 20 MG capsule  Take 20 mg by mouth daily.    ranitidine (ZANTAC) 150 MG tablet    valsartan-hydrochlorothiazide (DIOVAN-HCT) 320-25 MG tablet Take 1 tablet by mouth daily.    VENTOLIN HFA 108 (90 Base) MCG/ACT inhaler Inhale 2 puffs into the lungs every 4 (four) hours as needed.   [DISCONTINUED] glipiZIDE (GLUCOTROL) 10 MG tablet Take 10 mg by mouth daily before breakfast.    [DISCONTINUED] LEVEMIR FLEXTOUCH 100 UNIT/ML Pen Inject into the skin.    [DISCONTINUED] metoCLOPramide (REGLAN) 10 MG tablet Take 0.5 tablets (5 mg total) by mouth every 6 (six) hours as needed for nausea or vomiting (nausea/headache).   No facility-administered encounter medications on file as of 09/08/2020.    ALLERGIES: No Known Allergies  VACCINATION STATUS:  There is no immunization history on file for this patient.  Diabetes She presents for her initial diabetic visit. She has type 2 diabetes mellitus. Onset time: Diagnosed at approx age of 50. Her disease course has been worsening. There are no hypoglycemic associated  symptoms. Associated symptoms include blurred vision, fatigue, foot paresthesias, polydipsia and polyuria. There are no hypoglycemic complications. Symptoms are stable. Diabetic complications include nephropathy and peripheral neuropathy. Risk factors for coronary artery disease include diabetes mellitus, dyslipidemia, family history, hypertension, obesity and sedentary lifestyle. Current diabetic treatment includes insulin injections and oral agent (monotherapy). She is compliant with treatment most of the time. Her weight is stable. She is following a generally unhealthy diet. When asked about meal planning, she reported none. She has not had a previous visit with a dietitian. She never participates in exercise. (She presents today for her consultation, accompanied by her cousin who is also her guardian, with no meter or logs to review.  Her most recent A1c from 07/31/20 was 12.2%.  She routinely monitors glucose once  daily, before breakfast.  She admits to drinking mostly sugary beverages including Koolaid and tea and typically eats only 2 meals per day with occasional snacking.  Her cousin disagreed with her and said she typically reaches for the junk food quite frequently.  She does not engage in routine physical activity.  She is due for eye exam.  She denies any s/s of hypoglycemia.) An ACE inhibitor/angiotensin II receptor blocker is being taken. She does not see a podiatrist.Eye exam is not current.  Hypertension This is a chronic problem. The current episode started more than 1 year ago. The problem is unchanged. The problem is uncontrolled. Associated symptoms include blurred vision. There are no associated agents to hypertension. Risk factors for coronary artery disease include diabetes mellitus, dyslipidemia, family history, obesity and sedentary lifestyle. Past treatments include diuretics, angiotensin blockers, calcium channel blockers and alpha 1 blockers. The current treatment provides mild improvement. Compliance problems include diet, exercise and psychosocial issues.  Hypertensive end-organ damage includes kidney disease. Identifiable causes of hypertension include chronic renal disease.  Hyperlipidemia This is a chronic problem. The current episode started more than 1 year ago. The problem is controlled. Recent lipid tests were reviewed and are normal. Exacerbating diseases include chronic renal disease, diabetes and obesity. Factors aggravating her hyperlipidemia include fatty foods and thiazides. Current antihyperlipidemic treatment includes statins. The current treatment provides moderate improvement of lipids. Compliance problems include adherence to diet and adherence to exercise.  Risk factors for coronary artery disease include diabetes mellitus, dyslipidemia, family history, obesity, hypertension and a sedentary lifestyle.    Review of systems  Constitutional: + Minimally fluctuating body  weight, current Body mass index is 31.78 kg/m., no fatigue, no subjective hyperthermia, no subjective hypothermia Eyes: + blurry vision, no xerophthalmia ENT: no sore throat, no nodules palpated in throat, no dysphagia/odynophagia, no hoarseness Cardiovascular: no chest pain, no shortness of breath, no palpitations, no leg swelling Respiratory: no cough, no shortness of breath Gastrointestinal: no nausea/vomiting/diarrhea Genitourinary: + polyuria Musculoskeletal: no muscle/joint aches Skin: no rashes, no hyperemia Neurological: no tremors, + numbness/tingling to BLE, no dizziness Psychiatric: no depression, no anxiety  Objective:     BP (!) 157/92   Pulse (!) 59   Ht 5\' 8"  (1.727 m)   Wt 209 lb (94.8 kg)   BMI 31.78 kg/m   Wt Readings from Last 3 Encounters:  09/08/20 209 lb (94.8 kg)  04/11/17 185 lb (83.9 kg)     BP Readings from Last 3 Encounters:  09/08/20 (!) 157/92  04/11/17 (!) 144/73     Physical Exam- Limited  Constitutional:  Body mass index is 31.78 kg/m. , not in acute distress, unconcerned affect, avoids eye contact, ? Cognitive impairment Eyes:  EOMI, no exophthalmos Neck: Supple Cardiovascular: RRR, no murmurs, rubs, or gallops, no edema Respiratory: Adequate breathing efforts, no crackles, rales, rhonchi, or wheezing Musculoskeletal: no gross deformities, strength intact in all four extremities, no gross restriction of joint movements Skin:  no rashes, no hyperemia Neurological: no tremor with outstretched hands    CMP ( most recent) CMP     Component Value Date/Time   NA 137 07/31/2020 0000   K 4.4 07/31/2020 0000   CL 98 (A) 07/31/2020 0000   CO2 29 (A) 07/31/2020 0000   GLUCOSE 160 (H) 04/11/2017 2031   BUN 23 (A) 07/31/2020 0000   CREATININE 1.1 07/31/2020 0000   CREATININE 1.58 (H) 04/11/2017 2031   CALCIUM 10.0 07/31/2020 0000   PROT 8.1 04/11/2017 1625   ALBUMIN 4.3 07/31/2020 0000   AST 12 (A) 07/31/2020 0000   ALT 11 07/31/2020  0000   ALKPHOS 57 04/11/2017 1625   BILITOT 1.3 (H) 04/11/2017 1625   GFRNONAA 34 (L) 04/11/2017 2031   GFRAA 40 (L) 04/11/2017 2031     Diabetic Labs (most recent): Lab Results  Component Value Date   HGBA1C 12.2 07/31/2020     Lipid Panel ( most recent) Lipid Panel     Component Value Date/Time   TRIG 137 04/28/2020 0000   LDLCALC 85 04/28/2020 0000      No results found for: TSH, FREET4         Assessment & Plan:   1) Uncontrolled Type 2 Diabetes with hyperglycemia:  - Amandeep Hogston has currently uncontrolled symptomatic type 2 DM since 65 years of age, with most recent A1c of 12.2 %.   -Recent labs reviewed.  - I had a long discussion with her about the progressive nature of diabetes and the pathology behind its complications. -her diabetes is complicated by CKD stage 2 and she remains at a high risk for more acute and chronic complications which include CAD, CVA, CKD, retinopathy, and neuropathy. These are all discussed in detail with her.  - I have counseled her on diet and weight management by adopting a carbohydrate restricted/protein rich diet. Patient is encouraged to switch to unprocessed or minimally processed complex starch and increased protein intake (animal or plant source), fruits, and vegetables. -  she is advised to stick to a routine mealtimes to eat 3 meals a day and avoid unnecessary snacks (to snack only to correct hypoglycemia).   - she acknowledges that there is a room for improvement in her food and drink choices. - Suggestion is made for her to avoid simple carbohydrates from her diet including Cakes, Sweet Desserts, Ice Cream, Soda (diet and regular), Sweet Tea, Candies, Chips, Cookies, Store Bought Juices, Alcohol in Excess of 1-2 drinks a day, Artificial Sweeteners, Coffee Creamer, and "Sugar-free" Products. This will help patient to have more stable blood glucose profile and potentially avoid unintended weight gain.  - I have approached her  with the following individualized plan to manage her diabetes and patient agrees:   -She is advised to continue her current dose of Levemir at 30 units but change it to be administered before bed.  -she is encouraged to start monitoring glucose 4 times daily, before meals and before bed, to log their readings on the clinic sheets provided, and bring them to review at follow up appointment in 2 weeks.  - she is warned not to take insulin without proper monitoring per orders. - Adjustment parameters are given to her for hypo and hyperglycemia in writing. -  she is encouraged to call clinic for blood glucose levels less than 70 or above 300 mg /dl. - she is advised to continue Metformin 1000 mg po twice daily with meals, therapeutically suitable for patient .  - she will be considered for incretin therapy as appropriate next visit.  - Specific targets for  A1c; LDL, HDL, and Triglycerides were discussed with the patient.  2) Blood Pressure /Hypertension:  her blood pressure is controlled to target.   she is advised to continue her current medications including Norvasc 5 mg p.o. daily with breakfast, Clonidine 0.1 mg po daily, and Valsartan-HCT 320-25 mg po daily.  3) Lipids/Hyperlipidemia:    Review of her recent lipid panel from 04/28/20 showed controlled LDL at 85 .  she is advised to continue Lipitor 40 mg daily at bedtime.  Side effects and precautions discussed with her.  4)  Weight/Diet:  her Body mass index is 31.78 kg/m.  -  clearly complicating her diabetes care.   she is a candidate for weight loss. I discussed with her the fact that loss of 5 - 10% of her  current body weight will have the most impact on her diabetes management.  Exercise, and detailed carbohydrates information provided  -  detailed on discharge instructions.  5) Chronic Care/Health Maintenance: -she is on ACEI/ARB and Statin medications and is encouraged to initiate and continue to follow up with Ophthalmology,  Dentist, Podiatrist at least yearly or according to recommendations, and advised to stay away from smoking. I have recommended yearly flu vaccine and pneumonia vaccine at least every 5 years; moderate intensity exercise for up to 150 minutes weekly; and sleep for at least 7 hours a day.  - she is advised to maintain close follow up with Vidal Schwalbe, MD for primary care needs, as well as her other providers for optimal and coordinated care.   - Time spent in this patient care: 60 min, of which > 50% was spent in counseling her about her diabetes and the rest reviewing her blood glucose logs, discussing her hypoglycemia and hyperglycemia episodes, reviewing her current and previous labs/studies (including abstraction from other facilities) and medications doses and developing a long term treatment plan based on the latest standards of care/guidelines; and documenting her care.    Please refer to Patient Instructions for Blood Glucose Monitoring and Insulin/Medications Dosing Guide" in media tab for additional information. Please also refer to "Patient Self Inventory" in the Media tab for reviewed elements of pertinent patient history.  Arna Snipe participated in the discussions, expressed understanding, and voiced agreement with the above plans.  All questions were answered to her satisfaction. she is encouraged to contact clinic should she have any questions or concerns prior to her return visit.     Follow up plan: - Return in about 2 weeks (around 09/22/2020) for Diabetes F/U, Bring meter and logs, ABI next visit, No previsit labs.    Rayetta Pigg, [facility] [address] [place], [place] [address] Phone: [phone] Fax: [phone]  09/08/2020, 9:34 AM

## 2020-09-24 ENCOUNTER — Encounter: Payer: Self-pay | Admitting: Nurse Practitioner

## 2020-09-24 ENCOUNTER — Ambulatory Visit (INDEPENDENT_AMBULATORY_CARE_PROVIDER_SITE_OTHER): Payer: Medicare Other | Admitting: Nurse Practitioner

## 2020-09-24 VITALS — BP 159/94 | HR 73 | Ht 68.0 in | Wt 206.0 lb

## 2020-09-24 DIAGNOSIS — Z794 Long term (current) use of insulin: Secondary | ICD-10-CM

## 2020-09-24 DIAGNOSIS — E1165 Type 2 diabetes mellitus with hyperglycemia: Secondary | ICD-10-CM | POA: Diagnosis not present

## 2020-09-24 NOTE — Progress Notes (Signed)
Endocrinology Follow Up Note       09/24/2020, 11:13 AM   Subjective:    Patient ID: Martha Jennings, female    DOB: 1955/07/22.  Martha Jennings is being seen in follow up after being seen in consultation for management of currently uncontrolled symptomatic diabetes requested by  Vidal Schwalbe, MD.   Past Medical History:  Diagnosis Date   Asthma    Diabetes mellitus without complication ([place])    GERD (gastroesophageal reflux disease)    Hyperlipidemia     Past Surgical History:  Procedure Laterality Date   ABDOMINAL HYSTERECTOMY      Social History   Socioeconomic History   Marital status: Single    Spouse name: Not on file   Number of children: Not on file   Years of education: Not on file   Highest education level: Not on file  Occupational History   Not on file  Tobacco Use   Smoking status: Never   Smokeless tobacco: Never  Vaping Use   Vaping Use: Not on file  Substance and Sexual Activity   Alcohol use: No   Drug use: No   Sexual activity: Not on file  Other Topics Concern   Not on file  Social History Narrative   Not on file   Social Determinants of Health   Financial Resource Strain: Not on file  Food Insecurity: Not on file  Transportation Needs: Not on file  Physical Activity: Not on file  Stress: Not on file  Social Connections: Not on file    Family History  Problem Relation Age of Onset   Cancer Mother     Outpatient Encounter Medications as of 09/24/2020  Medication Sig   amLODipine (NORVASC) 5 MG tablet Take 5 mg by mouth daily.    atorvastatin (LIPITOR) 40 MG tablet Take 40 mg by mouth daily at 6 PM.    cloNIDine (CATAPRES) 0.1 MG tablet Take 0.1 mg by mouth daily.    insulin detemir (LEVEMIR FLEXTOUCH) 100 UNIT/ML FlexPen Inject 40 Units into the skin at bedtime.   metFORMIN (GLUCOPHAGE) 1000 MG tablet Take 1,000 mg by mouth 2 (two) times daily with a meal.     omeprazole (PRILOSEC) 20 MG capsule Take 20 mg by mouth daily.    ranitidine (ZANTAC) 150 MG tablet    valsartan-hydrochlorothiazide (DIOVAN-HCT) 320-25 MG tablet Take 1 tablet by mouth daily.    VENTOLIN HFA 108 (90 Base) MCG/ACT inhaler Inhale 2 puffs into the lungs every 4 (four) hours as needed.   No facility-administered encounter medications on file as of 09/24/2020.    ALLERGIES: No Known Allergies  VACCINATION STATUS:  There is no immunization history on file for this patient.  Diabetes She presents for her follow-up diabetic visit. She has type 2 diabetes mellitus. Onset time: Diagnosed at approx age of 10. Her disease course has been stable. There are no hypoglycemic associated symptoms. Associated symptoms include blurred vision, fatigue, foot paresthesias, polydipsia and polyuria. There are no hypoglycemic complications. Symptoms are stable. Diabetic complications include nephropathy and peripheral neuropathy. Risk factors for coronary artery disease include diabetes mellitus, dyslipidemia, family history, hypertension, obesity and sedentary lifestyle. Current diabetic treatment includes insulin injections  and oral agent (monotherapy). She is compliant with treatment most of the time. Her weight is fluctuating minimally. She is following a generally unhealthy diet. When asked about meal planning, she reported none. She has not had a previous visit with a dietitian. She never participates in exercise. Her home blood glucose trend is fluctuating minimally. Her overall blood glucose range is >200 mg/dl. (She presents today, accompanied by her cousin who is also her guardian, with her meter showing persistent hyperglycemia.  Analysis of her meter shows 7-day average of 297.   She is working on her diet, still consumes artificially sweetened beverages.  She denies any hypoglycemia.) An ACE inhibitor/angiotensin II receptor blocker is being taken. She does not see a podiatrist.Eye exam is not  current.  Hypertension This is a chronic problem. The current episode started more than 1 year ago. The problem is unchanged. The problem is uncontrolled. Associated symptoms include blurred vision. There are no associated agents to hypertension. Risk factors for coronary artery disease include diabetes mellitus, dyslipidemia, family history, obesity and sedentary lifestyle. Past treatments include diuretics, angiotensin blockers, calcium channel blockers and alpha 1 blockers. The current treatment provides mild improvement. Compliance problems include diet, exercise and psychosocial issues.  Hypertensive end-organ damage includes kidney disease. Identifiable causes of hypertension include chronic renal disease.  Hyperlipidemia This is a chronic problem. The current episode started more than 1 year ago. The problem is controlled. Recent lipid tests were reviewed and are normal. Exacerbating diseases include chronic renal disease, diabetes and obesity. Factors aggravating her hyperlipidemia include fatty foods and thiazides. Current antihyperlipidemic treatment includes statins. The current treatment provides moderate improvement of lipids. Compliance problems include adherence to diet and adherence to exercise.  Risk factors for coronary artery disease include diabetes mellitus, dyslipidemia, family history, obesity, hypertension and a sedentary lifestyle.    Review of systems  Constitutional: + Minimally fluctuating body weight, current Body mass index is 31.32 kg/m., no fatigue, no subjective hyperthermia, no subjective hypothermia Eyes: + blurry vision, no xerophthalmia ENT: no sore throat, no nodules palpated in throat, no dysphagia/odynophagia, no hoarseness Cardiovascular: no chest pain, no shortness of breath, no palpitations, no leg swelling Respiratory: no cough, no shortness of breath Gastrointestinal: no nausea/vomiting/diarrhea Genitourinary: + polyuria Musculoskeletal: no muscle/joint  aches Skin: no rashes, no hyperemia Neurological: no tremors, + numbness/tingling to BLE, no dizziness Psychiatric: no depression, no anxiety  Objective:     BP (!) 159/94   Pulse 73   Ht 5\' 8"  (1.727 m)   Wt 206 lb (93.4 kg)   BMI 31.32 kg/m   Wt Readings from Last 3 Encounters:  09/24/20 206 lb (93.4 kg)  09/08/20 209 lb (94.8 kg)  04/11/17 185 lb (83.9 kg)     BP Readings from Last 3 Encounters:  09/24/20 (!) 159/94  09/08/20 (!) 157/92  04/11/17 (!) 144/73     Physical Exam- Limited  Constitutional:  Body mass index is 31.32 kg/m. , not in acute distress, unconcerned affect, avoids eye contact, ? Cognitive impairment Eyes:  EOMI, no exophthalmos Neck: Supple Cardiovascular: RRR, no murmurs, rubs, or gallops, no edema Respiratory: Adequate breathing efforts, no crackles, rales, rhonchi, or wheezing Musculoskeletal: no gross deformities, strength intact in all four extremities, no gross restriction of joint movements Skin:  no rashes, no hyperemia Neurological: no tremor with outstretched hands    POCT ABI Results 09/24/20   Right ABI:  0.99      Left ABI:  1.00  Right leg systolic / diastolic:  200/116 mmHg Left leg systolic / diastolic: 130/865 mmHg  Arm systolic / diastolic: 784/696 mmHG  Detailed report will be scanned into patient chart.  CMP ( most recent) CMP     Component Value Date/Time   NA 137 07/31/2020 0000   K 4.4 07/31/2020 0000   CL 98 (A) 07/31/2020 0000   CO2 29 (A) 07/31/2020 0000   GLUCOSE 160 (H) 04/11/2017 2031   BUN 23 (A) 07/31/2020 0000   CREATININE 1.1 07/31/2020 0000   CREATININE 1.58 (H) 04/11/2017 2031   CALCIUM 10.0 07/31/2020 0000   PROT 8.1 04/11/2017 1625   ALBUMIN 4.3 07/31/2020 0000   AST 12 (A) 07/31/2020 0000   ALT 11 07/31/2020 0000   ALKPHOS 57 04/11/2017 1625   BILITOT 1.3 (H) 04/11/2017 1625   GFRNONAA 34 (L) 04/11/2017 2031   GFRAA 40 (L) 04/11/2017 2031     Diabetic Labs (most recent): Lab Results   Component Value Date   HGBA1C 12.2 07/31/2020     Lipid Panel ( most recent) Lipid Panel     Component Value Date/Time   TRIG 137 04/28/2020 0000   LDLCALC 85 04/28/2020 0000      No results found for: TSH, FREET4         Assessment & Plan:   1) Uncontrolled Type 2 Diabetes with hyperglycemia:  She presents today, accompanied by her cousin who is also her guardian, with her meter showing persistent hyperglycemia.  Analysis of her meter shows 7-day average of 297.   She is working on her diet, still consumes artificially sweetened beverages.  She denies any hypoglycemia.  - Martha Jennings has currently uncontrolled symptomatic type 2 DM since 65 years of age, with most recent A1c of 12.2 %.   -Recent labs reviewed.  - I had a long discussion with her about the progressive nature of diabetes and the pathology behind its complications. -her diabetes is complicated by CKD stage 2 and she remains at a high risk for more acute and chronic complications which include CAD, CVA, CKD, retinopathy, and neuropathy. These are all discussed in detail with her.  - Nutritional counseling repeated at each appointment due to patients tendency to fall back in to old habits.  - The patient admits there is a room for improvement in their diet and drink choices. -  Suggestion is made for the patient to avoid simple carbohydrates from their diet including Cakes, Sweet Desserts / Pastries, Ice Cream, Soda (diet and regular), Sweet Tea, Candies, Chips, Cookies, Sweet Pastries, Store Bought Juices, Alcohol in Excess of 1-2 drinks a day, Artificial Sweeteners, Coffee Creamer, and "Sugar-free" Products. This will help patient to have stable blood glucose profile and potentially avoid unintended weight gain.   - I encouraged the patient to switch to unprocessed or minimally processed complex starch and increased protein intake (animal or plant source), fruits, and vegetables.   - Patient is advised to  stick to a routine mealtimes to eat 3 meals a day and avoid unnecessary snacks (to snack only to correct hypoglycemia).  - I have approached her with the following individualized plan to manage her diabetes and patient agrees:   -Based on her hyperglycemia, she is advised to increase her Levemir to 40 units SQ nightly and continue Metformin 1000 mg po twice daily with meals for now (will recheck kidney function prior to next visit and adjust dose if necessary).  She may require multiple daily injections of insulin to control her diabetes.  -she is  encouraged to continue monitoring blood glucose at least twice daily, before breakfast and before bed, and to call the clinic if she has readings less than 70 or greater than 300 for 3 tests in a row.   - she is warned not to take insulin without proper monitoring per orders. - Adjustment parameters are given to her for hypo and hyperglycemia in writing.  - she will be considered for incretin therapy as appropriate next visit.  - Specific targets for  A1c; LDL, HDL, and Triglycerides were discussed with the patient.  2) Blood Pressure /Hypertension:  her blood pressure is not controlled to target today.   she is advised to continue her current medications including Norvasc 5 mg p.o. daily with breakfast, Clonidine 0.1 mg po daily, and Valsartan-HCT 320-25 mg po daily.  3) Lipids/Hyperlipidemia:    Review of her recent lipid panel from 04/28/20 showed controlled LDL at 85 .  she is advised to continue Lipitor 40 mg daily at bedtime.  Side effects and precautions discussed with her.  4)  Weight/Diet:  her Body mass index is 31.32 kg/m.  -  clearly complicating her diabetes care.   she is a candidate for weight loss. I discussed with her the fact that loss of 5 - 10% of her  current body weight will have the most impact on her diabetes management.  Exercise, and detailed carbohydrates information provided  -  detailed on discharge instructions.  5)  Chronic Care/Health Maintenance: -she is on ACEI/ARB and Statin medications and is encouraged to initiate and continue to follow up with Ophthalmology, Dentist, Podiatrist at least yearly or according to recommendations, and advised to stay away from smoking. I have recommended yearly flu vaccine and pneumonia vaccine at least every 5 years; moderate intensity exercise for up to 150 minutes weekly; and sleep for at least 7 hours a day.  - she is advised to maintain close follow up with Vidal Schwalbe, MD for primary care needs, as well as her other providers for optimal and coordinated care.  -Regarding her borderline ABI results: may repeat ABI in 1 year for surveillance purposes.  She does not require referral to vascular specialist at this time.   I spent 45 minutes in the care of the patient today including review of labs from [facility], Lipids, Thyroid Function, Hematology (current and previous including abstractions from other facilities); face-to-face time discussing  her blood glucose readings/logs, discussing hypoglycemia and hyperglycemia episodes and symptoms, medications doses, her options of short and long term treatment based on the latest standards of care / guidelines;  discussion about incorporating lifestyle medicine;  and documenting the encounter.    Please refer to Patient Instructions for Blood Glucose Monitoring and Insulin/Medications Dosing Guide"  in media tab for additional information. Please  also refer to " Patient Self Inventory" in the Media  tab for reviewed elements of pertinent patient history.  Martha Jennings participated in the discussions, expressed understanding, and voiced agreement with the above plans.  All questions were answered to her satisfaction. she is encouraged to contact clinic should she have any questions or concerns prior to her return visit.     Follow up plan: - Return in about 1 month (around 10/25/2020) for Diabetes F/U with A1c in office, Bring meter  and logs, Previsit labs.    Rayetta Pigg, [facility] [address] [place], [place] [address] Phone: [phone] Fax: [phone]  09/24/2020, 11:13 AM

## 2020-09-24 NOTE — Patient Instructions (Signed)

## 2020-10-27 ENCOUNTER — Ambulatory Visit: Payer: Medicare Other | Admitting: Nurse Practitioner

## 2020-11-05 ENCOUNTER — Other Ambulatory Visit: Payer: Self-pay | Admitting: Nurse Practitioner

## 2020-11-06 LAB — COMPLETE METABOLIC PANEL WITH GFR
AG Ratio: 1.4 (calc) (ref 1.0–2.5)
ALT: 11 U/L (ref 6–29)
AST: 10 U/L (ref 10–35)
Albumin: 4 g/dL (ref 3.6–5.1)
Alkaline phosphatase (APISO): 81 U/L (ref 37–153)
BUN/Creatinine Ratio: 17 (calc) (ref 6–22)
BUN: 19 mg/dL (ref 7–25)
CO2: 29 mmol/L (ref 20–32)
Calcium: 9.7 mg/dL (ref 8.6–10.4)
Chloride: 101 mmol/L (ref 98–110)
Creat: 1.11 mg/dL — ABNORMAL HIGH (ref 0.50–1.05)
Globulin: 2.9 g/dL (calc) (ref 1.9–3.7)
Glucose, Bld: 238 mg/dL — ABNORMAL HIGH (ref 65–99)
Potassium: 3.9 mmol/L (ref 3.5–5.3)
Sodium: 139 mmol/L (ref 135–146)
Total Bilirubin: 1 mg/dL (ref 0.2–1.2)
Total Protein: 6.9 g/dL (ref 6.1–8.1)
eGFR: 55 mL/min/{1.73_m2} — ABNORMAL LOW (ref >=60)

## 2020-11-06 LAB — T4, FREE: Free T4: 1.1 ng/dL (ref 0.8–1.8)

## 2020-11-06 LAB — TSH: TSH: 2.5 mIU/L (ref 0.40–4.50)

## 2020-11-12 ENCOUNTER — Encounter: Payer: Medicare Other | Admitting: Nurse Practitioner

## 2020-11-12 ENCOUNTER — Other Ambulatory Visit: Payer: Self-pay

## 2020-11-12 NOTE — Patient Instructions (Signed)
Diabetes Mellitus and Nutrition, Adult When you have diabetes, or diabetes mellitus, it is very important to have healthy eating habits because your blood sugar (glucose) levels are greatly affected by what you eat and drink. Eating healthy foods in the right amounts, at about the same times every day, can help you:  Control your blood glucose.  Lower your risk of heart disease.  Improve your blood pressure.  Reach or maintain a healthy weight. What can affect my meal plan? Every person with diabetes is different, and each person has different needs for a meal plan. Your health care provider may recommend that you work with a dietitian to make a meal plan that is best for you. Your meal plan may vary depending on factors such as:  The calories you need.  The medicines you take.  Your weight.  Your blood glucose, blood pressure, and cholesterol levels.  Your activity level.  Other health conditions you have, such as heart or kidney disease. How do carbohydrates affect me? Carbohydrates, also called carbs, affect your blood glucose level more than any other type of food. Eating carbs naturally raises the amount of glucose in your blood. Carb counting is a method for keeping track of how many carbs you eat. Counting carbs is important to keep your blood glucose at a healthy level, especially if you use insulin or take certain oral diabetes medicines. It is important to know how many carbs you can safely have in each meal. This is different for every person. Your dietitian can help you calculate how many carbs you should have at each meal and for each snack. How does alcohol affect me? Alcohol can cause a sudden decrease in blood glucose (hypoglycemia), especially if you use insulin or take certain oral diabetes medicines. Hypoglycemia can be a life-threatening condition. Symptoms of hypoglycemia, such as sleepiness, dizziness, and confusion, are similar to symptoms of having too much  alcohol.  Do not drink alcohol if: ? Your health care provider tells you not to drink. ? You are pregnant, may be pregnant, or are planning to become pregnant.  If you drink alcohol: ? Do not drink on an empty stomach. ? Limit how much you use to:  0-1 drink a day for women.  0-2 drinks a day for men. ? Be aware of how much alcohol is in your drink. In the [place] one drink equals one 12 oz bottle of beer (355 mL), one 5 oz glass of wine (148 mL), or one 1 oz glass of hard liquor (44 mL). ? Keep yourself hydrated with water, diet soda, or unsweetened iced tea.  Keep in mind that regular soda, juice, and other mixers may contain a lot of sugar and must be counted as carbs. What are tips for following this plan? Reading food labels  Start by checking the serving size on the "Nutrition Facts" label of packaged foods and drinks. The amount of calories, carbs, fats, and other nutrients listed on the label is based on one serving of the item. Many items contain more than one serving per package.  Check the total grams (g) of carbs in one serving. You can calculate the number of servings of carbs in one serving by dividing the total carbs by 15. For example, if a food has 30 g of total carbs per serving, it would be equal to 2 servings of carbs.  Check the number of grams (g) of saturated fats and trans fats in one serving. Choose foods that have   a low amount or none of these fats.  Check the number of milligrams (mg) of salt (sodium) in one serving. Most people should limit total sodium intake to less than 2,300 mg per day.  Always check the nutrition information of foods labeled as "low-fat" or "nonfat." These foods may be higher in added sugar or refined carbs and should be avoided.  Talk to your dietitian to identify your daily goals for nutrients listed on the label. Shopping  Avoid buying canned, pre-made, or processed foods. These foods tend to be high in fat, sodium, and added  sugar.  Shop around the outside edge of the grocery store. This is where you will most often find fresh fruits and vegetables, bulk grains, fresh meats, and fresh dairy. Cooking  Use low-heat cooking methods, such as baking, instead of high-heat cooking methods like deep frying.  Cook using healthy oils, such as olive, canola, or sunflower oil.  Avoid cooking with butter, cream, or high-fat meats. Meal planning  Eat meals and snacks regularly, preferably at the same times every day. Avoid going long periods of time without eating.  Eat foods that are high in fiber, such as fresh fruits, vegetables, beans, and whole grains. Talk with your dietitian about how many servings of carbs you can eat at each meal.  Eat 4-6 oz (112-168 g) of lean protein each day, such as lean meat, chicken, fish, eggs, or tofu. One ounce (oz) of lean protein is equal to: ? 1 oz (28 g) of meat, chicken, or fish. ? 1 egg. ?  cup (62 g) of tofu.  Eat some foods each day that contain healthy fats, such as avocado, nuts, seeds, and fish.   What foods should I eat? Fruits Berries. Apples. Oranges. Peaches. Apricots. Plums. Grapes. Mango. Papaya. Pomegranate. Kiwi. Cherries. Vegetables Lettuce. Spinach. Leafy greens, including kale, chard, collard greens, and mustard greens. Beets. Cauliflower. Cabbage. Broccoli. Carrots. Green beans. Tomatoes. Peppers. Onions. Cucumbers. Brussels sprouts. Grains Whole grains, such as whole-wheat or whole-grain bread, crackers, tortillas, cereal, and pasta. Unsweetened oatmeal. Quinoa. Brown or wild rice. Meats and other proteins Seafood. Poultry without skin. Lean cuts of poultry and beef. Tofu. Nuts. Seeds. Dairy Low-fat or fat-free dairy products such as milk, yogurt, and cheese. The items listed above may not be a complete list of foods and beverages you can eat. Contact a dietitian for more information. What foods should I avoid? Fruits Fruits canned with  syrup. Vegetables Canned vegetables. Frozen vegetables with butter or cream sauce. Grains Refined white flour and flour products such as bread, pasta, snack foods, and cereals. Avoid all processed foods. Meats and other proteins Fatty cuts of meat. Poultry with skin. Breaded or fried meats. Processed meat. Avoid saturated fats. Dairy Full-fat yogurt, cheese, or milk. Beverages Sweetened drinks, such as soda or iced tea. The items listed above may not be a complete list of foods and beverages you should avoid. Contact a dietitian for more information. Questions to ask a health care provider  Do I need to meet with a diabetes educator?  Do I need to meet with a dietitian?  What number can I call if I have questions?  When are the best times to check my blood glucose? Where to find more information:  American Diabetes Association: diabetes.org  Academy of Nutrition and Dietetics: www.eatright.org  National Institute of Diabetes and Digestive and Kidney Diseases: www.niddk.nih.gov  Association of Diabetes Care and Education Specialists: www.diabeteseducator.org Summary  It is important to have healthy eating   habits because your blood sugar (glucose) levels are greatly affected by what you eat and drink.  A healthy meal plan will help you control your blood glucose and maintain a healthy lifestyle.  Your health care provider may recommend that you work with a dietitian to make a meal plan that is best for you.  Keep in mind that carbohydrates (carbs) and alcohol have immediate effects on your blood glucose levels. It is important to count carbs and to use alcohol carefully. This information is not intended to replace advice given to you by your health care provider. Make sure you discuss any questions you have with your health care provider. Document Revised: 02/13/2019 Document Reviewed: 02/13/2019 Elsevier Patient Education  2021 [employer]  

## 2020-12-03 ENCOUNTER — Other Ambulatory Visit: Payer: Self-pay

## 2020-12-03 ENCOUNTER — Encounter: Payer: Self-pay | Admitting: Nurse Practitioner

## 2020-12-03 ENCOUNTER — Ambulatory Visit (INDEPENDENT_AMBULATORY_CARE_PROVIDER_SITE_OTHER): Payer: Medicare Other | Admitting: Nurse Practitioner

## 2020-12-03 VITALS — BP 150/84 | HR 60 | Ht 68.0 in | Wt 208.4 lb

## 2020-12-03 DIAGNOSIS — E1165 Type 2 diabetes mellitus with hyperglycemia: Secondary | ICD-10-CM | POA: Diagnosis not present

## 2020-12-03 DIAGNOSIS — Z794 Long term (current) use of insulin: Secondary | ICD-10-CM

## 2020-12-03 LAB — POCT GLYCOSYLATED HEMOGLOBIN (HGB A1C): Hemoglobin A1C: 11.2 % — AB (ref 4.0–5.6)

## 2020-12-03 LAB — POCT UA - MICROALBUMIN
Albumin/Creatinine Ratio, Urine, POC: <30
Creatinine, POC: 300 mg/dL
Microalbumin Ur, POC: 30 mg/L

## 2020-12-03 NOTE — Patient Instructions (Signed)

## 2020-12-03 NOTE — Progress Notes (Signed)
Endocrinology Follow Up Note       12/03/2020, 9:46 AM   Subjective:    Patient ID: Martha Jennings, female    DOB: 1955-03-25.  Lavesha Klehr is being seen in follow up after being seen in consultation for management of currently uncontrolled symptomatic diabetes requested by  Vidal Schwalbe, MD.   Past Medical History:  Diagnosis Date   Asthma    Diabetes mellitus without complication ([place])    GERD (gastroesophageal reflux disease)    Hyperlipidemia     Past Surgical History:  Procedure Laterality Date   ABDOMINAL HYSTERECTOMY      Social History   Socioeconomic History   Marital status: Single    Spouse name: Not on file   Number of children: Not on file   Years of education: Not on file   Highest education level: Not on file  Occupational History   Not on file  Tobacco Use   Smoking status: Never   Smokeless tobacco: Never  Vaping Use   Vaping Use: Never used  Substance and Sexual Activity   Alcohol use: No   Drug use: No   Sexual activity: Not on file  Other Topics Concern   Not on file  Social History Narrative   Not on file   Social Determinants of Health   Financial Resource Strain: Not on file  Food Insecurity: Not on file  Transportation Needs: Not on file  Physical Activity: Not on file  Stress: Not on file  Social Connections: Not on file    Family History  Problem Relation Age of Onset   Cancer Mother     Outpatient Encounter Medications as of 12/03/2020  Medication Sig   amLODipine (NORVASC) 10 MG tablet Take by mouth.   atorvastatin (LIPITOR) 40 MG tablet Take 40 mg by mouth daily at 6 PM.    cloNIDine (CATAPRES) 0.1 MG tablet Take 0.1 mg by mouth daily.    glipiZIDE (GLUCOTROL) 10 MG tablet Take 10 mg by mouth daily before breakfast.   insulin detemir (LEVEMIR FLEXTOUCH) 100 UNIT/ML FlexPen Inject 50 Units into the skin at bedtime.   metFORMIN (GLUCOPHAGE) 1000 MG  tablet Take 1,000 mg by mouth 2 (two) times daily with a meal.    omeprazole (PRILOSEC) 20 MG capsule Take 20 mg by mouth daily.    ranitidine (ZANTAC) 150 MG tablet    valsartan-hydrochlorothiazide (DIOVAN-HCT) 320-25 MG tablet Take 1 tablet by mouth daily.    VENTOLIN HFA 108 (90 Base) MCG/ACT inhaler Inhale 2 puffs into the lungs every 4 (four) hours as needed.   [DISCONTINUED] amLODipine (NORVASC) 5 MG tablet Take 5 mg by mouth daily.  (Patient not taking: Reported on 12/03/2020)   No facility-administered encounter medications on file as of 12/03/2020.    ALLERGIES: No Known Allergies  VACCINATION STATUS:  There is no immunization history on file for this patient.  Diabetes She presents for her follow-up diabetic visit. She has type 2 diabetes mellitus. Onset time: Diagnosed at approx age of 68. Her disease course has been improving. There are no hypoglycemic associated symptoms. Associated symptoms include blurred vision, fatigue, foot paresthesias, polydipsia and polyuria. There are no hypoglycemic complications. Symptoms are  improving. Diabetic complications include nephropathy and peripheral neuropathy. Risk factors for coronary artery disease include diabetes mellitus, dyslipidemia, family history, hypertension, obesity and sedentary lifestyle. Current diabetic treatment includes insulin injections and oral agent (dual therapy). She is compliant with treatment most of the time. Her weight is fluctuating minimally. She is following a generally unhealthy diet. When asked about meal planning, she reported none. She has not had a previous visit with a dietitian. She never participates in exercise. Her home blood glucose trend is decreasing steadily. Her breakfast blood glucose range is generally 140-180 mg/dl. Her overall blood glucose range is >200 mg/dl. (She presents today, accompanied by her cousin Clarene Critchley, with her meter, no logs showing near target fasting and significantly above target  postprandial glycemic profile.  Her POCT A1c today is 11.2%, improving from last visit of 12.2%.  Her PCP started her on Glipizide in between visits.  She denies any hypoglycemia.  She admits she still consumes sweetened beverages.  Analysis of her meter shows 7-day average of 230, 14-day average of 256, and 30-day average of 279.) An ACE inhibitor/angiotensin II receptor blocker is being taken. She does not see a podiatrist.Eye exam is not current.  Hypertension This is a chronic problem. The current episode started more than 1 year ago. The problem is unchanged. The problem is uncontrolled. Associated symptoms include blurred vision. There are no associated agents to hypertension. Risk factors for coronary artery disease include diabetes mellitus, dyslipidemia, family history, obesity and sedentary lifestyle. Past treatments include diuretics, angiotensin blockers, calcium channel blockers and alpha 1 blockers. The current treatment provides mild improvement. Compliance problems include diet, exercise and psychosocial issues.  Hypertensive end-organ damage includes kidney disease. Identifiable causes of hypertension include chronic renal disease.  Hyperlipidemia This is a chronic problem. The current episode started more than 1 year ago. The problem is controlled. Recent lipid tests were reviewed and are normal. Exacerbating diseases include chronic renal disease, diabetes and obesity. Factors aggravating her hyperlipidemia include fatty foods and thiazides. Current antihyperlipidemic treatment includes statins. The current treatment provides moderate improvement of lipids. Compliance problems include adherence to diet and adherence to exercise.  Risk factors for coronary artery disease include diabetes mellitus, dyslipidemia, family history, obesity, hypertension and a sedentary lifestyle.    Review of systems  Constitutional: + Minimally fluctuating body weight,  current Body mass index is 31.69 kg/m.  , no fatigue, no subjective hyperthermia, no subjective hypothermia Eyes: no blurry vision, no xerophthalmia ENT: no sore throat, no nodules palpated in throat, no dysphagia/odynophagia, no hoarseness Cardiovascular: no chest pain, no shortness of breath, no palpitations, no leg swelling Respiratory: no cough, no shortness of breath Gastrointestinal: no nausea/vomiting/diarrhea Musculoskeletal: no muscle/joint aches Skin: no rashes, no hyperemia Neurological: no tremors, no numbness, no tingling, no dizziness Psychiatric: no depression, no anxiety  Objective:     BP (!) 150/84   Pulse 60   Ht '5\' 8"'$  (1.727 m)   Wt 208 lb 6.4 oz (94.5 kg)   BMI 31.69 kg/m   Wt Readings from Last 3 Encounters:  12/03/20 208 lb 6.4 oz (94.5 kg)  09/24/20 206 lb (93.4 kg)  09/08/20 209 lb (94.8 kg)     BP Readings from Last 3 Encounters:  12/03/20 (!) 150/84  09/24/20 (!) 159/94  09/08/20 (!) 157/92     Physical Exam- Limited  Constitutional:  Body mass index is 31.69 kg/m. , not in acute distress, unconcerned affect, avoids eye contact, ? Cognitive impairment Eyes:  EOMI, no exophthalmos  Neck: Supple Cardiovascular: RRR, no murmurs, rubs, or gallops, no edema Respiratory: Adequate breathing efforts, no crackles, rales, rhonchi, or wheezing Musculoskeletal: no gross deformities, strength intact in all four extremities, no gross restriction of joint movements Skin:  no rashes, no hyperemia Neurological: no tremor with outstretched hands    CMP ( most recent) CMP     Component Value Date/Time   NA 139 11/05/2020 0819   NA 137 07/31/2020 0000   K 3.9 11/05/2020 0819   CL 101 11/05/2020 0819   CO2 29 11/05/2020 0819   GLUCOSE 238 (H) 11/05/2020 0819   BUN 19 11/05/2020 0819   BUN 23 (A) 07/31/2020 0000   CREATININE 1.11 (H) 11/05/2020 0819   CALCIUM 9.7 11/05/2020 0819   PROT 6.9 11/05/2020 0819   ALBUMIN 4.3 07/31/2020 0000   AST 10 11/05/2020 0819   ALT 11 11/05/2020 0819    ALKPHOS 57 04/11/2017 1625   BILITOT 1.0 11/05/2020 0819   GFRNONAA 34 (L) 04/11/2017 2031   GFRAA 40 (L) 04/11/2017 2031     Diabetic Labs (most recent): Lab Results  Component Value Date   HGBA1C 11.2 (A) 12/03/2020   HGBA1C 12.2 07/31/2020     Lipid Panel ( most recent) Lipid Panel     Component Value Date/Time   TRIG 137 04/28/2020 0000   LDLCALC 85 04/28/2020 0000      Lab Results  Component Value Date   TSH 2.50 11/05/2020   FREET4 1.1 11/05/2020           Assessment & Plan:   1) Uncontrolled Type 2 Diabetes with hyperglycemia:  She presents today, accompanied by her cousin Clarene Critchley, with her meter, no logs showing near target fasting and significantly above target postprandial glycemic profile.  Her POCT A1c today is 11.2%, improving from last visit of 12.2%.  Her PCP started her on Glipizide in between visits.  She denies any hypoglycemia.  She admits she still consumes sweetened beverages.  Analysis of her meter shows 7-day average of 230, 14-day average of 256, and 30-day average of 279.  - Fiore Kutzke has currently uncontrolled symptomatic type 2 DM since 65 years of age.  -Recent labs reviewed.  - I had a long discussion with her about the progressive nature of diabetes and the pathology behind its complications. -her diabetes is complicated by CKD stage 2 and she remains at a high risk for more acute and chronic complications which include CAD, CVA, CKD, retinopathy, and neuropathy. These are all discussed in detail with her.  - Nutritional counseling repeated at each appointment due to patients tendency to fall back in to old habits.  - The patient admits there is a room for improvement in their diet and drink choices. -  Suggestion is made for the patient to avoid simple carbohydrates from their diet including Cakes, Sweet Desserts / Pastries, Ice Cream, Soda (diet and regular), Sweet Tea, Candies, Chips, Cookies, Sweet Pastries, Store Bought Juices,  Alcohol in Excess of 1-2 drinks a day, Artificial Sweeteners, Coffee Creamer, and "Sugar-free" Products. This will help patient to have stable blood glucose profile and potentially avoid unintended weight gain.   - I encouraged the patient to switch to unprocessed or minimally processed complex starch and increased protein intake (animal or plant source), fruits, and vegetables.   - Patient is advised to stick to a routine mealtimes to eat 3 meals a day and avoid unnecessary snacks (to snack only to correct hypoglycemia).  - I have approached her  with the following individualized plan to manage her diabetes and patient agrees:   -Based on her hyperglycemia, she is advised to increase her Levemir to 50 units SQ nightly and continue Metformin 1000 mg po twice daily with meals for now (as kidneys are stable) and continue Glipizide 10 mg po daily with breakfast.   -she is encouraged to continue monitoring blood glucose at least twice daily, before breakfast and before bed, and to call the clinic if she has readings less than 70 or greater than 300 for 3 tests in a row.   - she is warned not to take insulin without proper monitoring per orders. - Adjustment parameters are given to her for hypo and hyperglycemia in writing.  - she will be considered for incretin therapy as appropriate next visit.  - Specific targets for  A1c; LDL, HDL, and Triglycerides were discussed with the patient.  2) Blood Pressure /Hypertension:  her blood pressure is not controlled to target today but is improving.   she is advised to continue her current medications including Norvasc 10 mg p.o. daily with breakfast, Clonidine 0.1 mg po daily, and Valsartan-HCT 320-25 mg po daily.  3) Lipids/Hyperlipidemia:    Review of her recent lipid panel from 04/28/20 showed controlled LDL at 85 .  she is advised to continue Lipitor 40 mg daily at bedtime.  Side effects and precautions discussed with her.  Will recheck lipid panel prior  to next visit.  4)  Weight/Diet:  her Body mass index is 31.69 kg/m.  -  clearly complicating her diabetes care.   she is a candidate for weight loss. I discussed with her the fact that loss of 5 - 10% of her  current body weight will have the most impact on her diabetes management.  Exercise, and detailed carbohydrates information provided  -  detailed on discharge instructions.  5) Chronic Care/Health Maintenance: -she is on ACEI/ARB and Statin medications and is encouraged to initiate and continue to follow up with Ophthalmology, Dentist, Podiatrist at least yearly or according to recommendations, and advised to stay away from smoking. I have recommended yearly flu vaccine and pneumonia vaccine at least every 5 years; moderate intensity exercise for up to 150 minutes weekly; and sleep for at least 7 hours a day.  - she is advised to maintain close follow up with Vidal Schwalbe, MD for primary care needs, as well as her other providers for optimal and coordinated care.      I spent 46 minutes in the care of the patient today including review of labs from [place], Lipids, Thyroid Function, Hematology (current and previous including abstractions from other facilities); face-to-face time discussing  her blood glucose readings/logs, discussing hypoglycemia and hyperglycemia episodes and symptoms, medications doses, her options of short and long term treatment based on the latest standards of care / guidelines;  discussion about incorporating lifestyle medicine;  and documenting the encounter.    Please refer to Patient Instructions for Blood Glucose Monitoring and Insulin/Medications Dosing Guide"  in media tab for additional information. Please  also refer to " Patient Self Inventory" in the Media  tab for reviewed elements of pertinent patient history.  Arna Snipe participated in the discussions, expressed understanding, and voiced agreement with the above plans.  All questions were answered to her  satisfaction. she is encouraged to contact clinic should she have any questions or concerns prior to her return visit.     Follow up plan: - Return in about 3 months (  around 03/04/2021) for Diabetes F/U with A1c in office, Previsit labs, Bring meter and logs.    Rayetta Pigg, [facility] [address] [place], [place] [address] Phone: [phone] Fax: [phone]  12/03/2020, 9:46 AM

## 2021-02-25 ENCOUNTER — Other Ambulatory Visit: Payer: Self-pay | Admitting: Nurse Practitioner

## 2021-02-26 LAB — COMPLETE METABOLIC PANEL WITH GFR
AG Ratio: 1.4 (calc) (ref 1.0–2.5)
ALT: 12 U/L (ref 6–29)
AST: 11 U/L (ref 10–35)
Albumin: 4 g/dL (ref 3.6–5.1)
Alkaline phosphatase (APISO): 98 U/L (ref 37–153)
BUN: 15 mg/dL (ref 7–25)
CO2: 28 mmol/L (ref 20–32)
Calcium: 9.6 mg/dL (ref 8.6–10.4)
Chloride: 100 mmol/L (ref 98–110)
Creat: 0.97 mg/dL (ref 0.50–1.05)
Globulin: 2.9 g/dL (calc) (ref 1.9–3.7)
Glucose, Bld: 306 mg/dL — ABNORMAL HIGH (ref 65–99)
Potassium: 3.8 mmol/L (ref 3.5–5.3)
Sodium: 139 mmol/L (ref 135–146)
Total Bilirubin: 0.5 mg/dL (ref 0.2–1.2)
Total Protein: 6.9 g/dL (ref 6.1–8.1)
eGFR: 65 mL/min/{1.73_m2} (ref >=60)

## 2021-02-26 LAB — LIPID PANEL
Cholesterol: 174 mg/dL (ref <200)
HDL: 59 mg/dL (ref >=50)
LDL Cholesterol (Calc): 89 mg/dL (calc)
Non-HDL Cholesterol (Calc): 115 mg/dL (calc) (ref <130)
Total CHOL/HDL Ratio: 2.9 (calc) (ref <5.0)
Triglycerides: 161 mg/dL — ABNORMAL HIGH (ref <150)

## 2021-03-04 ENCOUNTER — Encounter: Payer: Self-pay | Admitting: Nurse Practitioner

## 2021-03-04 ENCOUNTER — Other Ambulatory Visit: Payer: Self-pay

## 2021-03-04 ENCOUNTER — Ambulatory Visit (INDEPENDENT_AMBULATORY_CARE_PROVIDER_SITE_OTHER): Payer: Medicare Other | Admitting: Nurse Practitioner

## 2021-03-04 VITALS — BP 149/84 | HR 82 | Ht 68.0 in | Wt 208.6 lb

## 2021-03-04 DIAGNOSIS — I10 Essential (primary) hypertension: Secondary | ICD-10-CM

## 2021-03-04 DIAGNOSIS — Z794 Long term (current) use of insulin: Secondary | ICD-10-CM

## 2021-03-04 DIAGNOSIS — E782 Mixed hyperlipidemia: Secondary | ICD-10-CM

## 2021-03-04 DIAGNOSIS — E1165 Type 2 diabetes mellitus with hyperglycemia: Secondary | ICD-10-CM | POA: Diagnosis not present

## 2021-03-04 LAB — POCT GLYCOSYLATED HEMOGLOBIN (HGB A1C): HbA1c, POC (controlled diabetic range): 12.3 % — AB (ref 0.0–7.0)

## 2021-03-04 MED ORDER — GLIPIZIDE 10 MG PO TABS: 10.0000 mg | ORAL_TABLET | Freq: Two times a day (BID) | ORAL | 3 refills | Status: DC

## 2021-03-04 NOTE — Progress Notes (Signed)
Endocrinology Follow Up Note       03/04/2021, 9:49 AM   Subjective:    Patient ID: Martha Jennings, female    DOB: April 30, 1955.  Kalina Morabito is being seen in follow up after being seen in consultation for management of currently uncontrolled symptomatic diabetes requested by  Vidal Schwalbe, MD.   Past Medical History:  Diagnosis Date   Asthma    Diabetes mellitus without complication ([place])    GERD (gastroesophageal reflux disease)    Hyperlipidemia     Past Surgical History:  Procedure Laterality Date   ABDOMINAL HYSTERECTOMY      Social History   Socioeconomic History   Marital status: Single    Spouse name: Not on file   Number of children: Not on file   Years of education: Not on file   Highest education level: Not on file  Occupational History   Not on file  Tobacco Use   Smoking status: Never   Smokeless tobacco: Never  Vaping Use   Vaping Use: Never used  Substance and Sexual Activity   Alcohol use: No   Drug use: No   Sexual activity: Not on file  Other Topics Concern   Not on file  Social History Narrative   Not on file   Social Determinants of Health   Financial Resource Strain: Not on file  Food Insecurity: Not on file  Transportation Needs: Not on file  Physical Activity: Not on file  Stress: Not on file  Social Connections: Not on file    Family History  Problem Relation Age of Onset   Cancer Mother     Outpatient Encounter Medications as of 03/04/2021  Medication Sig   amLODipine (NORVASC) 5 MG tablet Take 5 mg by mouth daily.   atorvastatin (LIPITOR) 40 MG tablet Take 40 mg by mouth daily at 6 PM.    cloNIDine (CATAPRES) 0.1 MG tablet Take 0.1 mg by mouth daily.    glucose blood (TRUE METRIX BLOOD GLUCOSE TEST) test strip CHECK BLOOD SUGAR ONCE DAILY   insulin detemir (LEVEMIR FLEXTOUCH) 100 UNIT/ML FlexPen Inject 50 Units into the skin at bedtime.   metFORMIN  (GLUCOPHAGE) 1000 MG tablet Take 1,000 mg by mouth 2 (two) times daily with a meal.    omeprazole (PRILOSEC) 20 MG capsule Take 20 mg by mouth daily.    ranitidine (ZANTAC) 150 MG tablet    valsartan-hydrochlorothiazide (DIOVAN-HCT) 320-25 MG tablet Take 1 tablet by mouth daily.    VENTOLIN HFA 108 (90 Base) MCG/ACT inhaler Inhale 2 puffs into the lungs every 4 (four) hours as needed.   [DISCONTINUED] glipiZIDE (GLUCOTROL) 10 MG tablet Take 10 mg by mouth daily before breakfast.   glipiZIDE (GLUCOTROL) 10 MG tablet Take 1 tablet (10 mg total) by mouth 2 (two) times daily before a meal.   [DISCONTINUED] amLODipine (NORVASC) 10 MG tablet Take by mouth. (Patient not taking: Reported on 03/04/2021)   No facility-administered encounter medications on file as of 03/04/2021.    ALLERGIES: No Known Allergies  VACCINATION STATUS:  There is no immunization history on file for this patient.  Diabetes She presents for her follow-up diabetic visit. She has type 2 diabetes mellitus.  Onset time: Diagnosed at approx age of 62. Her disease course has been worsening. There are no hypoglycemic associated symptoms. Associated symptoms include blurred vision, fatigue, foot paresthesias, polydipsia and polyuria. There are no hypoglycemic complications. Symptoms are stable. Diabetic complications include nephropathy and peripheral neuropathy. Risk factors for coronary artery disease include diabetes mellitus, dyslipidemia, family history, hypertension, obesity and sedentary lifestyle. Current diabetic treatment includes insulin injections and oral agent (dual therapy). She is compliant with treatment most of the time. Her weight is fluctuating minimally. She is following a generally unhealthy diet. When asked about meal planning, she reported none. She has not had a previous visit with a dietitian. She never participates in exercise. Her home blood glucose trend is increasing steadily. Her overall blood glucose range  is >200 mg/dl. (She presents today, accompanied by her sister, with her meter, no logs showing persistently high glycemic profile overall.  It is hard to tell if she is checking her glucose routinely due to the monitor having the incorrect date and time (I corrected this for her today).  Her POCT A1c today is 12.3%, increasing from last visit of 11.2%.  Her sister reports she is eating and drinking whatever she wants despite knowing she needs to avoid it.  She denies any significant hypoglycemia.) An ACE inhibitor/angiotensin II receptor blocker is being taken. She does not see a podiatrist.Eye exam is not current.  Hypertension This is a chronic problem. The current episode started more than 1 year ago. The problem is unchanged. The problem is uncontrolled. Associated symptoms include blurred vision. There are no associated agents to hypertension. Risk factors for coronary artery disease include diabetes mellitus, dyslipidemia, family history, obesity and sedentary lifestyle. Past treatments include diuretics, angiotensin blockers, calcium channel blockers and alpha 1 blockers. The current treatment provides mild improvement. Compliance problems include diet, exercise and psychosocial issues.  Hypertensive end-organ damage includes kidney disease. Identifiable causes of hypertension include chronic renal disease.  Hyperlipidemia This is a chronic problem. The current episode started more than 1 year ago. The problem is controlled. Recent lipid tests were reviewed and are variable. Exacerbating diseases include chronic renal disease, diabetes and obesity. Factors aggravating her hyperlipidemia include fatty foods and thiazides. Current antihyperlipidemic treatment includes statins. The current treatment provides moderate improvement of lipids. Compliance problems include adherence to diet and adherence to exercise.  Risk factors for coronary artery disease include diabetes mellitus, dyslipidemia, family history,  obesity, hypertension and a sedentary lifestyle.    Review of systems  Constitutional: + Minimally fluctuating body weight,  current Body mass index is 31.72 kg/m. , no fatigue, no subjective hyperthermia, no subjective hypothermia Eyes: no blurry vision, no xerophthalmia ENT: no sore throat, no nodules palpated in throat, no dysphagia/odynophagia, no hoarseness Cardiovascular: no chest pain, no shortness of breath, no palpitations, no leg swelling Respiratory: no cough, no shortness of breath Gastrointestinal: no nausea/vomiting/diarrhea Musculoskeletal: no muscle/joint aches Skin: no rashes, no hyperemia Neurological: no tremors, no numbness, no tingling, no dizziness Psychiatric: no depression, no anxiety  Objective:     BP (!) 149/84    Pulse 82    Ht 5\' 8"  (1.727 m)    Wt 208 lb 9.6 oz (94.6 kg)    BMI 31.72 kg/m   Wt Readings from Last 3 Encounters:  03/04/21 208 lb 9.6 oz (94.6 kg)  12/03/20 208 lb 6.4 oz (94.5 kg)  09/24/20 206 lb (93.4 kg)     BP Readings from Last 3 Encounters:  03/04/21 (!) 149/84  12/03/20 (!) 150/84  09/24/20 (!) 159/94     Physical Exam- Limited  Constitutional:  Body mass index is 31.72 kg/m. , not in acute distress, unconcerned affect, avoids eye contact, ? Cognitive impairment Eyes:  EOMI, no exophthalmos Neck: Supple Cardiovascular: RRR, no murmurs, rubs, or gallops, no edema Respiratory: Adequate breathing efforts, no crackles, rales, rhonchi, or wheezing Musculoskeletal: no gross deformities, strength intact in all four extremities, no gross restriction of joint movements Skin:  no rashes, no hyperemia Neurological: no tremor with outstretched hands    CMP ( most recent) CMP     Component Value Date/Time   NA 139 02/25/2021 0852   NA 137 07/31/2020 0000   K 3.8 02/25/2021 0852   CL 100 02/25/2021 0852   CO2 28 02/25/2021 0852   GLUCOSE 306 (H) 02/25/2021 0852   BUN 15 02/25/2021 0852   BUN 23 (A) 07/31/2020 0000    CREATININE 0.97 02/25/2021 0852   CALCIUM 9.6 02/25/2021 0852   PROT 6.9 02/25/2021 0852   ALBUMIN 4.3 07/31/2020 0000   AST 11 02/25/2021 0852   ALT 12 02/25/2021 0852   ALKPHOS 57 04/11/2017 1625   BILITOT 0.5 02/25/2021 0852   GFRNONAA 34 (L) 04/11/2017 2031   GFRAA 40 (L) 04/11/2017 2031     Diabetic Labs (most recent): Lab Results  Component Value Date   HGBA1C 12.3 (A) 03/04/2021   HGBA1C 11.2 (A) 12/03/2020   HGBA1C 12.2 07/31/2020     Lipid Panel ( most recent) Lipid Panel     Component Value Date/Time   CHOL 174 02/25/2021 0852   TRIG 161 (H) 02/25/2021 0852   HDL 59 02/25/2021 0852   CHOLHDL 2.9 02/25/2021 0852   LDLCALC 89 02/25/2021 0852      Lab Results  Component Value Date   TSH 2.50 11/05/2020   FREET4 1.1 11/05/2020           Assessment & Plan:   1) Uncontrolled Type 2 Diabetes with hyperglycemia:  She presents today, accompanied by her sister, with her meter, no logs showing persistently high glycemic profile overall.  It is hard to tell if she is checking her glucose routinely due to the monitor having the incorrect date and time (I corrected this for her today).  Her POCT A1c today is 12.3%, increasing from last visit of 11.2%.  Her sister reports she is eating and drinking whatever she wants despite knowing she needs to avoid it.  She denies any significant hypoglycemia.  Analysis of her meter shows 7-day average of 253, 14 and 30-day average of 278.  - Shada Nienaber has currently uncontrolled symptomatic type 2 DM since 65 years of age.  -Recent labs reviewed.  - I had a long discussion with her about the progressive nature of diabetes and the pathology behind its complications. -her diabetes is complicated by CKD stage 2 and she remains at a high risk for more acute and chronic complications which include CAD, CVA, CKD, retinopathy, and neuropathy. These are all discussed in detail with her.  - Nutritional counseling repeated at each  appointment due to patients tendency to fall back in to old habits.  - The patient admits there is a room for improvement in their diet and drink choices. -  Suggestion is made for the patient to avoid simple carbohydrates from their diet including Cakes, Sweet Desserts / Pastries, Ice Cream, Soda (diet and regular), Sweet Tea, Candies, Chips, Cookies, Sweet Pastries, Store Bought Juices, Alcohol in Excess of 1-2  drinks a day, Artificial Sweeteners, Coffee Creamer, and "Sugar-free" Products. This will help patient to have stable blood glucose profile and potentially avoid unintended weight gain.   - I encouraged the patient to switch to unprocessed or minimally processed complex starch and increased protein intake (animal or plant source), fruits, and vegetables.   - Patient is advised to stick to a routine mealtimes to eat 3 meals a day and avoid unnecessary snacks (to snack only to correct hypoglycemia).  - I have approached her with the following individualized plan to manage her diabetes and patient agrees:   -Based on her hyperglycemia, she is advised to increase her Levemir to 50 units (had only been taking 40) SQ nightly and continue Metformin 1000 mg po twice daily with meals for now (as kidneys are stable) and increase Glipizide 10 mg po twice daily with meals.   -We discussed the possibility of needing prandial insulin to help manage her diabetes but given her limited support, will try to avoid it at this time for safety reasons.  -she is encouraged to continue monitoring blood glucose at least twice daily, before breakfast and before bed, and to call the clinic if she has readings less than 70 or greater than 300 for 3 tests in a row.   - she is warned not to take insulin without proper monitoring per orders. - Adjustment parameters are given to her for hypo and hyperglycemia in writing.  - she will be considered for incretin therapy as appropriate next visit.  - Specific targets for   A1c; LDL, HDL, and Triglycerides were discussed with the patient.  2) Blood Pressure /Hypertension:  her blood pressure is not controlled to target today but is improving.   she is advised to continue her current medications including Norvasc 5 mg p.o. daily with breakfast, Clonidine 0.1 mg po daily, and Valsartan-HCT 320-25 mg po daily.  3) Lipids/Hyperlipidemia:    Review of her recent lipid panel from 02/25/21 showed controlled LDL at 89 and slightly elevated triglycerides of 161.  she is advised to continue Lipitor 40 mg daily at bedtime.  She is also advised to avoid fried foods and butter.  Side effects and precautions discussed with her.    4)  Weight/Diet:  her Body mass index is 31.72 kg/m.  -  clearly complicating her diabetes care.   she is a candidate for weight loss. I discussed with her the fact that loss of 5 - 10% of her  current body weight will have the most impact on her diabetes management.  Exercise, and detailed carbohydrates information provided  -  detailed on discharge instructions.  5) Chronic Care/Health Maintenance: -she is on ACEI/ARB and Statin medications and is encouraged to initiate and continue to follow up with Ophthalmology, Dentist, Podiatrist at least yearly or according to recommendations, and advised to stay away from smoking. I have recommended yearly flu vaccine and pneumonia vaccine at least every 5 years; moderate intensity exercise for up to 150 minutes weekly; and sleep for at least 7 hours a day.  - she is advised to maintain close follow up with Vidal Schwalbe, MD for primary care needs, as well as her other providers for optimal and coordinated care.      I spent 30 minutes in the care of the patient today including review of labs from [facility], Lipids, Thyroid Function, Hematology (current and previous including abstractions from other facilities); face-to-face time discussing  her blood glucose readings/logs, discussing hypoglycemia and hyperglycemia  episodes and symptoms, medications doses, her options of short and long term treatment based on the latest standards of care / guidelines;  discussion about incorporating lifestyle medicine;  and documenting the encounter.    Please refer to Patient Instructions for Blood Glucose Monitoring and Insulin/Medications Dosing Guide"  in media tab for additional information. Please  also refer to " Patient Self Inventory" in the Media  tab for reviewed elements of pertinent patient history.  Arna Snipe participated in the discussions, expressed understanding, and voiced agreement with the above plans.  All questions were answered to her satisfaction. she is encouraged to contact clinic should she have any questions or concerns prior to her return visit.     Follow up plan: - Return in about 3 months (around 06/02/2021) for Diabetes F/U with A1c in office, No previsit labs, Bring meter and logs.    Rayetta Pigg, [facility] [address] [place], [place] [address] Phone: [phone] Fax: [phone]  03/04/2021, 9:49 AM

## 2021-03-04 NOTE — Patient Instructions (Signed)

## 2021-05-25 ENCOUNTER — Other Ambulatory Visit: Payer: Self-pay | Admitting: *Deleted

## 2021-05-25 DIAGNOSIS — D1720 Benign lipomatous neoplasm of skin and subcutaneous tissue of unspecified limb: Secondary | ICD-10-CM

## 2021-06-02 ENCOUNTER — Ambulatory Visit (INDEPENDENT_AMBULATORY_CARE_PROVIDER_SITE_OTHER): Payer: Medicare Other | Admitting: General Surgery

## 2021-06-02 ENCOUNTER — Encounter: Payer: Self-pay | Admitting: General Surgery

## 2021-06-02 ENCOUNTER — Other Ambulatory Visit: Payer: Self-pay

## 2021-06-02 VITALS — BP 146/87 | HR 62 | Temp 97.2°F | Resp 14 | Ht 68.0 in | Wt 214.0 lb

## 2021-06-02 DIAGNOSIS — D1720 Benign lipomatous neoplasm of skin and subcutaneous tissue of unspecified limb: Secondary | ICD-10-CM

## 2021-06-02 NOTE — Progress Notes (Signed)
Martha Jennings; [id number]; 12/20/1955 ? ? ?HPI ?Patient is a 66 year old black female who was referred to my care by Dr. Bartolo Darter for evaluation and treatment of multiple lipomas.  Most of the history was obtained from the patient's daughter.  They report there has not been any growth or change in the lipomas for many years.  None are tender to touch.  She is currently being treated for uncontrolled diabetes mellitus.  Her A1c is 12. ?Past Medical History:  ?Diagnosis Date  ? Asthma   ? Diabetes mellitus without complication ([place])   ? GERD (gastroesophageal reflux disease)   ? Hyperlipidemia   ? ? ?Past Surgical History:  ?Procedure Laterality Date  ? ABDOMINAL HYSTERECTOMY    ? ? ?Family History  ?Problem Relation Age of Onset  ? Cancer Mother   ? ? ?Current Outpatient Medications on File Prior to Visit  ?Medication Sig Dispense Refill  ? amLODipine (NORVASC) 5 MG tablet Take 5 mg by mouth daily.    ? atorvastatin (LIPITOR) 40 MG tablet Take 40 mg by mouth daily at 6 PM.     ? cloNIDine (CATAPRES) 0.1 MG tablet Take 0.1 mg by mouth daily.     ? glipiZIDE (GLUCOTROL) 10 MG tablet Take 1 tablet (10 mg total) by mouth 2 (two) times daily before a meal. 180 tablet 3  ? glucose blood (TRUE METRIX BLOOD GLUCOSE TEST) test strip CHECK BLOOD SUGAR ONCE DAILY    ? metFORMIN (GLUCOPHAGE) 1000 MG tablet Take 1,000 mg by mouth 2 (two) times daily with a meal.     ? omeprazole (PRILOSEC) 20 MG capsule Take 20 mg by mouth daily.     ? valsartan-hydrochlorothiazide (DIOVAN-HCT) 320-25 MG tablet Take 1 tablet by mouth daily.     ? VENTOLIN HFA 108 (90 Base) MCG/ACT inhaler Inhale 2 puffs into the lungs every 4 (four) hours as needed.    ? insulin detemir (LEVEMIR FLEXTOUCH) 100 UNIT/ML FlexPen Inject 50 Units into the skin at bedtime. (Patient not taking: Reported on 06/02/2021)    ? ?No current facility-administered medications on file prior to visit.  ? ? ?No Known Allergies ? ?Social History  ? ?Substance and Sexual Activity  ?Alcohol  Use No  ? ? ?Social History  ? ?Tobacco Use  ?Smoking Status Never  ?Smokeless Tobacco Never  ? ? ?Review of Systems  ?Constitutional: Negative.   ?HENT: Negative.    ?Eyes: Negative.   ?Respiratory:  Positive for wheezing.   ?Cardiovascular: Negative.   ?Gastrointestinal: Negative.   ?Genitourinary:  Positive for frequency.  ?Skin:  Positive for rash.  ?Neurological: Negative.   ?Endo/Heme/Allergies: Negative.   ?Psychiatric/Behavioral: Negative.    ? ?Objective  ? ?Vitals:  ? 06/02/21 1315  ?BP: (!) 146/87  ?Pulse: 62  ?Resp: 14  ?Temp: (!) 97.2 ?F (36.2 ?C)  ?SpO2: 96%  ? ? ?Physical Exam ?Vitals reviewed.  ?Constitutional:   ?   Appearance: Normal appearance. She is not ill-appearing.  ?HENT:  ?   Head: Normocephalic and atraumatic.  ?Cardiovascular:  ?   Rate and Rhythm: Normal rate and regular rhythm.  ?   Heart sounds: Normal heart sounds. No murmur heard. ?  No friction rub. No gallop.  ?Pulmonary:  ?   Effort: Pulmonary effort is normal. No respiratory distress.  ?   Breath sounds: No stridor. No wheezing, rhonchi or rales.  ?Skin: ?   General: Skin is warm and dry.  ?   Comments: Multiple subcutaneous rubbery mobile nontender masses  of varying size in both upper extremities.  Greater than 3 are noted in the left arm and greater than 2 are noted in the right arm.  All are nontender.  ?Neurological:  ?   Mental Status: She is alert and oriented to person, place, and time.  ? ?Primary care notes reviewed ?Assessment  ?Subcutaneous masses of upper extremities consistent with lipomas ?Uncontrolled diabetes mellitus ?Plan  ?I told the patient that there was no indication to excise any of the lipomas at the present time.  They were given strict instructions to return should there be any change in size or increasing tenderness.  They understand and agree.  Literature was given.  She was also encouraged to get her diabetes under control.  Follow-up here as needed. ?

## 2021-06-03 ENCOUNTER — Encounter: Payer: Self-pay | Admitting: Nurse Practitioner

## 2021-06-03 ENCOUNTER — Ambulatory Visit (INDEPENDENT_AMBULATORY_CARE_PROVIDER_SITE_OTHER): Payer: Medicare Other | Admitting: Nurse Practitioner

## 2021-06-03 VITALS — BP 168/92 | HR 76 | Ht 68.0 in | Wt 211.8 lb

## 2021-06-03 DIAGNOSIS — E782 Mixed hyperlipidemia: Secondary | ICD-10-CM | POA: Diagnosis not present

## 2021-06-03 DIAGNOSIS — Z794 Long term (current) use of insulin: Secondary | ICD-10-CM | POA: Diagnosis not present

## 2021-06-03 DIAGNOSIS — E1165 Type 2 diabetes mellitus with hyperglycemia: Secondary | ICD-10-CM

## 2021-06-03 DIAGNOSIS — Z91199 Patient's noncompliance with other medical treatment and regimen due to unspecified reason: Secondary | ICD-10-CM | POA: Diagnosis not present

## 2021-06-03 DIAGNOSIS — I10 Essential (primary) hypertension: Secondary | ICD-10-CM | POA: Diagnosis not present

## 2021-06-03 LAB — POCT GLYCOSYLATED HEMOGLOBIN (HGB A1C): HbA1c, POC (controlled diabetic range): 12.4 % — AB (ref 0.0–7.0)

## 2021-06-03 NOTE — Progress Notes (Signed)
? ?                                                    Endocrinology Follow Up Note  ?     06/03/2021, 10:29 AM ? ? ?Subjective:  ? ? Patient ID: Martha Jennings, female    DOB: [birth year].  ?Martha Jennings is being seen in follow up after being seen in consultation for management of currently uncontrolled symptomatic diabetes requested by  Vidal Schwalbe, MD. ? ? ?Past Medical History:  ?Diagnosis Date  ? Asthma   ? Diabetes mellitus without complication ([place])   ? GERD (gastroesophageal reflux disease)   ? Hyperlipidemia   ? ? ?Past Surgical History:  ?Procedure Laterality Date  ? ABDOMINAL HYSTERECTOMY    ? ? ?Social History  ? ?Socioeconomic History  ? Marital status: Single  ?  Spouse name: Not on file  ? Number of children: Not on file  ? Years of education: Not on file  ? Highest education level: Not on file  ?Occupational History  ? Not on file  ?Tobacco Use  ? Smoking status: Never  ? Smokeless tobacco: Never  ?Vaping Use  ? Vaping Use: Never used  ?Substance and Sexual Activity  ? Alcohol use: No  ? Drug use: No  ? Sexual activity: Not on file  ?Other Topics Concern  ? Not on file  ?Social History Narrative  ? Not on file  ? ?Social Determinants of Health  ? ?Financial Resource Strain: Not on file  ?Food Insecurity: Not on file  ?Transportation Needs: Not on file  ?Physical Activity: Not on file  ?Stress: Not on file  ?Social Connections: Not on file  ? ? ?Family History  ?Problem Relation Age of Onset  ? Cancer Mother   ? ? ?Outpatient Encounter Medications as of 06/03/2021  ?Medication Sig  ? amLODipine (NORVASC) 5 MG tablet Take 5 mg by mouth daily.  ? atorvastatin (LIPITOR) 40 MG tablet Take 40 mg by mouth daily at 6 PM.   ? B-D ULTRAFINE III SHORT PEN 31G X 8 MM MISC SMARTSIG:1 Each SUB-Q Daily  ? cloNIDine (CATAPRES) 0.1 MG tablet Take 0.1 mg by mouth daily.   ? glipiZIDE (GLUCOTROL) 10 MG tablet Take 1 tablet (10 mg total) by mouth 2 (two) times daily before a meal.  ? glucose blood (TRUE  METRIX BLOOD GLUCOSE TEST) test strip CHECK BLOOD SUGAR ONCE DAILY  ? insulin detemir (LEVEMIR FLEXTOUCH) 100 UNIT/ML FlexPen Inject 50 Units into the skin at bedtime.  ? metFORMIN (GLUCOPHAGE) 1000 MG tablet Take 1,000 mg by mouth 2 (two) times daily with a meal.   ? omeprazole (PRILOSEC) 20 MG capsule Take 20 mg by mouth daily.   ? valsartan-hydrochlorothiazide (DIOVAN-HCT) 320-25 MG tablet Take 1 tablet by mouth daily.   ? VENTOLIN HFA 108 (90 Base) MCG/ACT inhaler Inhale 2 puffs into the lungs every 4 (four) hours as needed.  ? ?No facility-administered encounter medications on file as of 06/03/2021.  ? ? ?ALLERGIES: ?No Known Allergies ? ?VACCINATION STATUS: ? ?There is no immunization history on file for this patient. ? ?Diabetes ?She presents for her follow-up diabetic visit. She has type 2 diabetes mellitus. Onset time: Diagnosed at approx age of 89. Her disease course has been worsening. There are no hypoglycemic associated symptoms. Associated symptoms include blurred vision,  fatigue, foot paresthesias, polydipsia and polyuria. There are no hypoglycemic complications. Symptoms are stable. Diabetic complications include nephropathy and peripheral neuropathy. Risk factors for coronary artery disease include diabetes mellitus, dyslipidemia, family history, hypertension, obesity and sedentary lifestyle. Current diabetic treatment includes insulin injections and oral agent (dual therapy). She is compliant with treatment most of the time. Her weight is fluctuating minimally. She is following a generally unhealthy diet. When asked about meal planning, she reported none. She has not had a previous visit with a dietitian. She never participates in exercise. Her overall blood glucose range is >200 mg/dl. (She presents today with her meter, no logs, showing inconsistent glucose monitoring with gross hyperglycemia.  Her POCT A1c today is 12.4% increasing slightly from last visit of 12.3%.  She says she has been  injecting 40 units of Levemir instead of the recommended 50 at last visit.  She says she has not missed any doses and takes her oral medications as prescribed.  Yesterday she skipped breakfast, at a tossed salad and a piece of toast for lunch and a fried fish dinner for supper with SF koolaid.  Analysis of her meter shows 7-14-and 30 day averages of 281.) An ACE inhibitor/angiotensin II receptor blocker is being taken. She does not see a podiatrist.Eye exam is not current.  ?Hypertension ?This is a chronic problem. The current episode started more than 1 year ago. The problem is unchanged. The problem is uncontrolled. Associated symptoms include blurred vision. There are no associated agents to hypertension. Risk factors for coronary artery disease include diabetes mellitus, dyslipidemia, family history, obesity and sedentary lifestyle. Past treatments include diuretics, angiotensin blockers, calcium channel blockers and alpha 1 blockers. The current treatment provides mild improvement. Compliance problems include diet, exercise and psychosocial issues.  Hypertensive end-organ damage includes kidney disease. Identifiable causes of hypertension include chronic renal disease.  ?Hyperlipidemia ?This is a chronic problem. The current episode started more than 1 year ago. The problem is controlled. Recent lipid tests were reviewed and are variable. Exacerbating diseases include chronic renal disease, diabetes and obesity. Factors aggravating her hyperlipidemia include fatty foods and thiazides. Current antihyperlipidemic treatment includes statins. The current treatment provides moderate improvement of lipids. Compliance problems include adherence to diet and adherence to exercise.  Risk factors for coronary artery disease include diabetes mellitus, dyslipidemia, family history, obesity, hypertension and a sedentary lifestyle.  ? ?Review of systems ? ?Constitutional: + Minimally fluctuating body weight,  current Body  mass index is 32.2 kg/m?. , no fatigue, no subjective hyperthermia, no subjective hypothermia ?Eyes: no blurry vision, no xerophthalmia ?ENT: no sore throat, no nodules palpated in throat, no dysphagia/odynophagia, no hoarseness ?Cardiovascular: no chest pain, no shortness of breath, no palpitations, no leg swelling ?Respiratory: no cough, no shortness of breath ?Gastrointestinal: no nausea/vomiting/diarrhea ?Musculoskeletal: no muscle/joint aches ?Skin: no rashes, no hyperemia ?Neurological: no tremors, no numbness, no tingling, no dizziness ?Psychiatric: no depression, no anxiety ? ?Objective:  ?  ? ?BP (!) 168/92   Pulse 76   Ht '5\' 8"'$  (1.727 m)   Wt 211 lb 12.8 oz (96.1 kg)   SpO2 97%   BMI 32.20 kg/m?   ?Wt Readings from Last 3 Encounters:  ?06/03/21 211 lb 12.8 oz (96.1 kg)  ?06/02/21 214 lb (97.1 kg)  ?03/04/21 208 lb 9.6 oz (94.6 kg)  ?  ? ?BP Readings from Last 3 Encounters:  ?06/03/21 (!) 168/92  ?06/02/21 (!) 146/87  ?03/04/21 (!) 149/84  ?  ? ?Physical Exam- Limited ? ?Constitutional:  Body mass index is 32.2 kg/m?. , not in acute distress, unconcerned affect, avoids eye contact, ? Cognitive impairment ?Eyes:  EOMI, no exophthalmos ?Neck: Supple ?Cardiovascular: RRR, no murmurs, rubs, or gallops, no edema ?Respiratory: Adequate breathing efforts, no crackles, rales, rhonchi, or wheezing ?Musculoskeletal: no gross deformities, strength intact in all four extremities, no gross restriction of joint movements ?Skin:  no rashes, no hyperemia ?Neurological: no tremor with outstretched hands ? ? ? ?CMP ( most recent) ?CMP  ?   ?Component Value Date/Time  ? NA 139 02/25/2021 0852  ? NA 137 07/31/2020 0000  ? K 3.8 02/25/2021 0852  ? CL 100 02/25/2021 0852  ? CO2 28 02/25/2021 0852  ? GLUCOSE 306 (H) 02/25/2021 3267  ? BUN 15 02/25/2021 0852  ? BUN 23 (A) 07/31/2020 0000  ? CREATININE 0.97 02/25/2021 0852  ? CALCIUM 9.6 02/25/2021 0852  ? PROT 6.9 02/25/2021 0852  ? ALBUMIN 4.3 07/31/2020 0000  ? AST 11  02/25/2021 0852  ? ALT 12 02/25/2021 0852  ? ALKPHOS 57 04/11/2017 1625  ? BILITOT 0.5 02/25/2021 0852  ? GFRNONAA 34 (L) 04/11/2017 2031  ? GFRAA 40 (L) 04/11/2017 2031  ? ? ? ?Diabetic Labs (most recent): ?Lab Results

## 2021-06-03 NOTE — Patient Instructions (Signed)
Diabetes Mellitus Emergency Preparedness Plan ?A diabetes emergency preparedness plan is a checklist to make sure you have everything you need to manage your diabetes in case of an emergency, such as an evacuation, natural disaster, national security emergency, or pandemic lockdown. ?Managing your diabetes is something you have to do all day every day. The American Diabetes Association and the American College of Endocrinology both recommend putting together an emergency diabetes kit. Your kit should include important information and documents as well as all the supplies you will need to manage your diabetes for at least 1 week. Store it in a portable, waterproof bag or container. The best time to start making your emergency kit is now. ?How to make your emergency kit ?Collect information and documents ?Include the following information and documents in your kit: ?The type of diabetes you have. ?A copy of your health insurance cards and photo ID. ?A list of all your other medical conditions, allergies, and surgeries. ?A list of all your medicines and doses with the contact information for your pharmacy. Ask your health care provider for a list of your current medicines. ?Any recent lab results, including your latest hemoglobin A1C (HbA1C). ?The make, model, and serial number of your insulin pump, if you use one. Also include contact information for the manufacturer. ?Contact information for people who should be notified in case of an emergency. Include your health care provider's name, address, and phone number. ?Collect diabetes care items ?Include the following diabetes care items in your kit: ?At least a 1-week supply of: ?Oral medicines. ?Insulin. ?Blood glucose testing supplies. These include testing strips, lancets, and extra batteries for your blood glucose monitor and pump. ?A charger for the continuous glucose monitor (CGM) receiver and pump. ?Any extra supplies needed for your CGM or pump. ?A supply of  glucagon, glucose tablets, juice, soda, or hard candy in case of hypoglycemia. ?Coolers or cold packs. ?A safe container for syringes, needles, and lancets. ? ?Other preparations ?Other things to consider doing as part of your emergency plan: ?Make sure that your mobile phone is charged and that you have an extra charger, cable, or batteries. ?Choose a meeting place for family members. ?Wear a medical alert or ID bracelet. ?If you have a child with diabetes, make sure your child's school has a copy of his or her emergency plan, including the name of the staff member who will assist your child. ?Where to find more information ?American Diabetes Association: www.diabetes.org ?Centers for Disease Control and Prevention: blogs.cdc.gov ?Summary ?A diabetes emergency preparedness plan is a checklist to make sure you have everything you need in case of an emergency. ?Your kit should include important information and documents as well as all the supplies you will need to manage your condition for at least 1 week. ?Store your kit in a portable, waterproof bag or container. ?The best time to start making your emergency kit is now. ?This information is not intended to replace advice given to you by your health care provider. Make sure you discuss any questions you have with your health care provider. ?Document Revised: 09/13/2019 Document Reviewed: 09/13/2019 ?Elsevier Patient Education ? 2022 Else[employer] Inc. ? ?

## 2021-09-01 ENCOUNTER — Other Ambulatory Visit: Payer: Self-pay | Admitting: Nurse Practitioner

## 2021-09-02 LAB — COMPLETE METABOLIC PANEL WITH GFR
AG Ratio: 1.4 (calc) (ref 1.0–2.5)
ALT: 11 U/L (ref 6–29)
AST: 12 U/L (ref 10–35)
Albumin: 4.1 g/dL (ref 3.6–5.1)
Alkaline phosphatase (APISO): 77 U/L (ref 37–153)
BUN/Creatinine Ratio: 15 (calc) (ref 6–22)
BUN: 16 mg/dL (ref 7–25)
CO2: 25 mmol/L (ref 20–32)
Calcium: 9.7 mg/dL (ref 8.6–10.4)
Chloride: 105 mmol/L (ref 98–110)
Creat: 1.09 mg/dL — ABNORMAL HIGH (ref 0.50–1.05)
Globulin: 2.9 g/dL (calc) (ref 1.9–3.7)
Glucose, Bld: 222 mg/dL — ABNORMAL HIGH (ref 65–99)
Potassium: 4.1 mmol/L (ref 3.5–5.3)
Sodium: 140 mmol/L (ref 135–146)
Total Bilirubin: 0.9 mg/dL (ref 0.2–1.2)
Total Protein: 7 g/dL (ref 6.1–8.1)
eGFR: 56 mL/min/{1.73_m2} — ABNORMAL LOW (ref >=60)

## 2021-09-03 ENCOUNTER — Ambulatory Visit: Payer: Medicare Other | Admitting: Nurse Practitioner

## 2021-09-08 NOTE — Patient Instructions (Signed)
Diabetes Mellitus and Foot Care Foot care is an important part of your health, especially when you have diabetes. Diabetes may cause you to have problems because of poor blood flow (circulation) to your feet and legs, which can cause your skin to: Become thinner and drier. Break more easily. Heal more slowly. Peel and crack. You may also have nerve damage (neuropathy) in your legs and feet, causing decreased feeling in them. This means that you may not notice minor injuries to your feet that could lead to more serious problems. Noticing and addressing any potential problems early is the best way to prevent future foot problems. How to care for your feet Foot hygiene  Wash your feet daily with warm water and mild soap. Do not use hot water. Then, pat your feet and the areas between your toes until they are completely dry. Do not soak your feet as this can dry your skin. Trim your toenails straight across. Do not dig under them or around the cuticle. File the edges of your nails with an emery board or nail file. Apply a moisturizing lotion or petroleum jelly to the skin on your feet and to dry, brittle toenails. Use lotion that does not contain alcohol and is unscented. Do not apply lotion between your toes. Shoes and socks Wear clean socks or stockings every day. Make sure they are not too tight. Do not wear knee-high stockings since they may decrease blood flow to your legs. Wear shoes that fit properly and have enough cushioning. Always look in your shoes before you put them on to be sure there are no objects inside. To break in new shoes, wear them for just a few hours a day. This prevents injuries on your feet. Wounds, scrapes, corns, and calluses  Check your feet daily for blisters, cuts, bruises, sores, and redness. If you cannot see the bottom of your feet, use a mirror or ask someone for help. Do not cut corns or calluses or try to remove them with medicine. If you find a minor scrape,  cut, or break in the skin on your feet, keep it and the skin around it clean and dry. You may clean these areas with mild soap and water. Do not clean the area with peroxide, alcohol, or iodine. If you have a wound, scrape, corn, or callus on your foot, look at it several times a day to make sure it is healing and not infected. Check for: Redness, swelling, or pain. Fluid or blood. Warmth. Pus or a bad smell. General tips Do not cross your legs. This may decrease blood flow to your feet. Do not use heating pads or hot water bottles on your feet. They may burn your skin. If you have lost feeling in your feet or legs, you may not know this is happening until it is too late. Protect your feet from hot and cold by wearing shoes, such as at the beach or on hot pavement. Schedule a complete foot exam at least once a year (annually) or more often if you have foot problems. Report any cuts, sores, or bruises to your health care provider immediately. Where to find more information American Diabetes Association: www.diabetes.org Association of Diabetes Care & Education Specialists: www.diabeteseducator.org Contact a health care provider if: You have a medical condition that increases your risk of infection and you have any cuts, sores, or bruises on your feet. You have an injury that is not healing. You have redness on your legs or feet. You   feel burning or tingling in your legs or feet. You have pain or cramps in your legs and feet. Your legs or feet are numb. Your feet always feel cold. You have pain around any toenails. Get help right away if: You have a wound, scrape, corn, or callus on your foot and: You have pain, swelling, or redness that gets worse. You have fluid or blood coming from the wound, scrape, corn, or callus. Your wound, scrape, corn, or callus feels warm to the touch. You have pus or a bad smell coming from the wound, scrape, corn, or callus. You have a fever. You have a red  line going up your leg. Summary Check your feet every day for blisters, cuts, bruises, sores, and redness. Apply a moisturizing lotion or petroleum jelly to the skin on your feet and to dry, brittle toenails. Wear shoes that fit properly and have enough cushioning. If you have foot problems, report any cuts, sores, or bruises to your health care provider immediately. Schedule a complete foot exam at least once a year (annually) or more often if you have foot problems. This information is not intended to replace advice given to you by your health care provider. Make sure you discuss any questions you have with your health care provider. Document Revised: 09/27/2019 Document Reviewed: 09/27/2019 Elsevier Patient Education  2023 [employer].  

## 2021-09-09 ENCOUNTER — Encounter: Payer: Self-pay | Admitting: Nurse Practitioner

## 2021-09-09 ENCOUNTER — Ambulatory Visit (INDEPENDENT_AMBULATORY_CARE_PROVIDER_SITE_OTHER): Payer: Medicare Other | Admitting: Nurse Practitioner

## 2021-09-09 VITALS — BP 121/79 | HR 74 | Ht 68.0 in | Wt 213.0 lb

## 2021-09-09 DIAGNOSIS — E1165 Type 2 diabetes mellitus with hyperglycemia: Secondary | ICD-10-CM

## 2021-09-09 DIAGNOSIS — I10 Essential (primary) hypertension: Secondary | ICD-10-CM | POA: Diagnosis not present

## 2021-09-09 DIAGNOSIS — Z91199 Patient's noncompliance with other medical treatment and regimen due to unspecified reason: Secondary | ICD-10-CM | POA: Diagnosis not present

## 2021-09-09 DIAGNOSIS — Z794 Long term (current) use of insulin: Secondary | ICD-10-CM

## 2021-09-09 DIAGNOSIS — E782 Mixed hyperlipidemia: Secondary | ICD-10-CM | POA: Diagnosis not present

## 2021-09-09 LAB — POCT GLYCOSYLATED HEMOGLOBIN (HGB A1C): HbA1c POC (<> result, manual entry): 10.1 % (ref 4.0–5.6)

## 2021-09-09 NOTE — Progress Notes (Signed)
Endocrinology Follow Up Note       09/09/2021, 12:41 PM   Subjective:    Patient ID: Martha Jennings, female    DOB: 08-19-1955.  Martha Jennings is being seen in follow up after being seen in consultation for management of currently uncontrolled symptomatic diabetes requested by  Vidal Schwalbe, MD.   Past Medical History:  Diagnosis Date   Asthma    Diabetes mellitus without complication ([place])    GERD (gastroesophageal reflux disease)    Hyperlipidemia     Past Surgical History:  Procedure Laterality Date   ABDOMINAL HYSTERECTOMY      Social History   Socioeconomic History   Marital status: Single    Spouse name: Not on file   Number of children: Not on file   Years of education: Not on file   Highest education level: Not on file  Occupational History   Not on file  Tobacco Use   Smoking status: Never   Smokeless tobacco: Never  Vaping Use   Vaping Use: Never used  Substance and Sexual Activity   Alcohol use: No   Drug use: No   Sexual activity: Not on file  Other Topics Concern   Not on file  Social History Narrative   Not on file   Social Determinants of Health   Financial Resource Strain: Not on file  Food Insecurity: Not on file  Transportation Needs: Not on file  Physical Activity: Not on file  Stress: Not on file  Social Connections: Not on file    Family History  Problem Relation Age of Onset   Cancer Mother     Outpatient Encounter Medications as of 09/09/2021  Medication Sig   amLODipine (NORVASC) 5 MG tablet Take 5 mg by mouth daily.   atorvastatin (LIPITOR) 40 MG tablet Take 40 mg by mouth daily at 6 PM.    B-D ULTRAFINE III SHORT PEN 31G X 8 MM MISC SMARTSIG:1 Each SUB-Q Daily   cloNIDine (CATAPRES) 0.1 MG tablet Take 0.1 mg by mouth daily.    glipiZIDE (GLUCOTROL) 10 MG tablet Take 1 tablet (10 mg total) by mouth 2 (two) times daily before a meal.   glucose blood (TRUE  METRIX BLOOD GLUCOSE TEST) test strip CHECK BLOOD SUGAR ONCE DAILY   insulin detemir (LEVEMIR FLEXTOUCH) 100 UNIT/ML FlexPen Inject 50 Units into the skin at bedtime.   metFORMIN (GLUCOPHAGE) 1000 MG tablet Take 1,000 mg by mouth 2 (two) times daily with a meal.    omeprazole (PRILOSEC) 20 MG capsule Take 20 mg by mouth daily.    valsartan-hydrochlorothiazide (DIOVAN-HCT) 320-25 MG tablet Take 1 tablet by mouth daily.    VENTOLIN HFA 108 (90 Base) MCG/ACT inhaler Inhale 2 puffs into the lungs every 4 (four) hours as needed.   No facility-administered encounter medications on file as of 09/09/2021.    ALLERGIES: No Known Allergies  VACCINATION STATUS:  There is no immunization history on file for this patient.  Diabetes She presents for her follow-up diabetic visit. She has type 2 diabetes mellitus. Onset time: Diagnosed at approx age of 52. Her disease course has been improving. There are no hypoglycemic associated symptoms. Associated symptoms include blurred vision,  fatigue, foot paresthesias, polydipsia and polyuria. There are no hypoglycemic complications. Symptoms are stable. Diabetic complications include nephropathy and peripheral neuropathy. Risk factors for coronary artery disease include diabetes mellitus, dyslipidemia, family history, hypertension, obesity and sedentary lifestyle. Current diabetic treatment includes insulin injections and oral agent (dual therapy). She is compliant with treatment most of the time. Her weight is fluctuating minimally. She is following a generally unhealthy diet. When asked about meal planning, she reported none. She has not had a previous visit with a dietitian. She never participates in exercise. Her overall blood glucose range is 180-200 mg/dl. (She presents today, accompanied by her Aunt, with her meter, no logs, showing inconsistent glucose monitoring pattern but improving numbers.  Her POCT A1c today is 10.1%, improving from last visit of 12.4%.  She  denies any s/s of hypoglycemia.  Analysis of her meter shows 7 and 14-day averages of 180, and 30-day average of 364.) An ACE inhibitor/angiotensin II receptor blocker is being taken. She does not see a podiatrist.Eye exam is not current.  Hypertension This is a chronic problem. The current episode started more than 1 year ago. The problem has been gradually improving since onset. The problem is controlled. Associated symptoms include blurred vision. There are no associated agents to hypertension. Risk factors for coronary artery disease include diabetes mellitus, dyslipidemia, family history, obesity and sedentary lifestyle. Past treatments include diuretics, angiotensin blockers, calcium channel blockers and alpha 1 blockers. The current treatment provides mild improvement. Compliance problems include diet, exercise and psychosocial issues.  Hypertensive end-organ damage includes kidney disease. Identifiable causes of hypertension include chronic renal disease.  Hyperlipidemia This is a chronic problem. The current episode started more than 1 year ago. The problem is controlled. Recent lipid tests were reviewed and are variable. Exacerbating diseases include chronic renal disease, diabetes and obesity. Factors aggravating her hyperlipidemia include fatty foods and thiazides. Current antihyperlipidemic treatment includes statins. The current treatment provides moderate improvement of lipids. Compliance problems include adherence to diet and adherence to exercise.  Risk factors for coronary artery disease include diabetes mellitus, dyslipidemia, family history, obesity, hypertension and a sedentary lifestyle.    Review of systems  Constitutional: + Minimally fluctuating body weight,  current Body mass index is 32.39 kg/m. , no fatigue, no subjective hyperthermia, no subjective hypothermia Eyes: no blurry vision, no xerophthalmia ENT: no sore throat, no nodules palpated in throat, no  dysphagia/odynophagia, no hoarseness Cardiovascular: no chest pain, no shortness of breath, no palpitations, no leg swelling Respiratory: no cough, no shortness of breath Gastrointestinal: no nausea/vomiting/diarrhea Musculoskeletal: no muscle/joint aches Skin: no rashes, no hyperemia Neurological: no tremors, no numbness, no tingling, no dizziness Psychiatric: no depression, no anxiety  Objective:     BP 121/79   Pulse 74   Ht '5\' 8"'$  (1.727 m)   Wt 213 lb (96.6 kg)   BMI 32.39 kg/m   Wt Readings from Last 3 Encounters:  09/09/21 213 lb (96.6 kg)  06/03/21 211 lb 12.8 oz (96.1 kg)  06/02/21 214 lb (97.1 kg)     BP Readings from Last 3 Encounters:  09/09/21 121/79  06/03/21 (!) 168/92  06/02/21 (!) 146/87     Physical Exam- Limited  Constitutional:  Body mass index is 32.39 kg/m. , not in acute distress, unconcerned affect, avoids eye contact, ? Cognitive impairment Eyes:  EOMI, no exophthalmos Neck: Supple Cardiovascular: RRR, no murmurs, rubs, or gallops, no edema Respiratory: Adequate breathing efforts, no crackles, rales, rhonchi, or wheezing Musculoskeletal: no gross deformities, strength  intact in all four extremities, no gross restriction of joint movements Skin:  no rashes, no hyperemia Neurological: no tremor with outstretched hands    CMP ( most recent) CMP     Component Value Date/Time   NA 140 09/01/2021 0956   NA 137 07/31/2020 0000   K 4.1 09/01/2021 0956   CL 105 09/01/2021 0956   CO2 25 09/01/2021 0956   GLUCOSE 222 (H) 09/01/2021 0956   BUN 16 09/01/2021 0956   BUN 23 (A) 07/31/2020 0000   CREATININE 1.09 (H) 09/01/2021 0956   CALCIUM 9.7 09/01/2021 0956   PROT 7.0 09/01/2021 0956   ALBUMIN 4.3 07/31/2020 0000   AST 12 09/01/2021 0956   ALT 11 09/01/2021 0956   ALKPHOS 57 04/11/2017 1625   BILITOT 0.9 09/01/2021 0956   GFRNONAA 34 (L) 04/11/2017 2031   GFRAA 40 (L) 04/11/2017 2031     Diabetic Labs (most recent): Lab Results   Component Value Date   HGBA1C 10.1 09/09/2021   HGBA1C 12.4 (A) 06/03/2021   HGBA1C 12.3 (A) 03/04/2021   MICROALBUR 30 12/03/2020     Lipid Panel ( most recent) Lipid Panel     Component Value Date/Time   CHOL 174 02/25/2021 0852   TRIG 161 (H) 02/25/2021 0852   HDL 59 02/25/2021 0852   CHOLHDL 2.9 02/25/2021 0852   LDLCALC 89 02/25/2021 0852      Lab Results  Component Value Date   TSH 2.50 11/05/2020   FREET4 1.1 11/05/2020           Assessment & Plan:   1) Uncontrolled Type 2 Diabetes with hyperglycemia:  She presents today, accompanied by her Aunt, with her meter, no logs, showing inconsistent glucose monitoring pattern but improving numbers.  Her POCT A1c today is 10.1%, improving from last visit of 12.4%.  She denies any s/s of hypoglycemia.  Analysis of her meter shows 7 and 14-day averages of 180, and 30-day average of 364.  - Azelie Noguera has currently uncontrolled symptomatic type 2 DM since 66 years of age.  -Recent labs reviewed.  - I had a long discussion with her about the progressive nature of diabetes and the pathology behind its complications. -her diabetes is complicated by CKD stage 2 and she remains at a high risk for more acute and chronic complications which include CAD, CVA, CKD, retinopathy, and neuropathy. These are all discussed in detail with her.  - Nutritional counseling repeated at each appointment due to patients tendency to fall back in to old habits.  - The patient admits there is a room for improvement in their diet and drink choices. -  Suggestion is made for the patient to avoid simple carbohydrates from their diet including Cakes, Sweet Desserts / Pastries, Ice Cream, Soda (diet and regular), Sweet Tea, Candies, Chips, Cookies, Sweet Pastries, Store Bought Juices, Alcohol in Excess of 1-2 drinks a day, Artificial Sweeteners, Coffee Creamer, and "Sugar-free" Products. This will help patient to have stable blood glucose profile and  potentially avoid unintended weight gain.   - I encouraged the patient to switch to unprocessed or minimally processed complex starch and increased protein intake (animal or plant source), fruits, and vegetables.   - Patient is advised to stick to a routine mealtimes to eat 3 meals a day and avoid unnecessary snacks (to snack only to correct hypoglycemia).  - I have approached her with the following individualized plan to manage her diabetes and patient agrees:   -Her diabetes management is  complicated by suspected noncompliance with dietary regimen and insulin injections.  -She is advised to continue her Levemir 50 units SQ nightly and continue Metformin 1000 mg po twice daily with meals for now (as kidneys are stable) and continue Glipizide 10 mg po twice daily with meals.   -We discussed the possibility of needing prandial insulin to help manage her diabetes but given her limited support, will try to avoid it at this time for safety reasons.  -she is encouraged to continue monitoring blood glucose at least twice daily, before breakfast and before bed, and to call the clinic if she has readings less than 70 or greater than 300 for 3 tests in a row.  We discussed possibly using CGM to help compliance but she is not interested at this time.  - she is warned not to take insulin without proper monitoring per orders. - Adjustment parameters are given to her for hypo and hyperglycemia in writing.  - she will be considered for incretin therapy as appropriate next visit.  - Specific targets for  A1c; LDL, HDL, and Triglycerides were discussed with the patient.  2) Blood Pressure /Hypertension:  her blood pressure is controlled to target.   she is advised to continue her current medications including Norvasc 5 mg p.o. daily with breakfast, Clonidine 0.1 mg po daily, and Valsartan-HCT 320-25 mg po daily.  3) Lipids/Hyperlipidemia:    Review of her recent lipid panel from 02/25/21 showed controlled  LDL at 89 and slightly elevated triglycerides of 161.  she is advised to continue Lipitor 40 mg daily at bedtime.  She is also advised to avoid fried foods and butter.  Side effects and precautions discussed with her.    4)  Weight/Diet:  her Body mass index is 32.39 kg/m.  -  clearly complicating her diabetes care.   she is a candidate for weight loss. I discussed with her the fact that loss of 5 - 10% of her  current body weight will have the most impact on her diabetes management.  Exercise, and detailed carbohydrates information provided  -  detailed on discharge instructions.  5) Chronic Care/Health Maintenance: -she is on ACEI/ARB and Statin medications and is encouraged to initiate and continue to follow up with Ophthalmology, Dentist, Podiatrist at least yearly or according to recommendations, and advised to stay away from smoking. I have recommended yearly flu vaccine and pneumonia vaccine at least every 5 years; moderate intensity exercise for up to 150 minutes weekly; and sleep for at least 7 hours a day.  - she is advised to maintain close follow up with Vidal Schwalbe, MD for primary care needs, as well as her other providers for optimal and coordinated care.      I spent 35 minutes in the care of the patient today including review of labs from [facility], Lipids, Thyroid Function, Hematology (current and previous including abstractions from other facilities); face-to-face time discussing  her blood glucose readings/logs, discussing hypoglycemia and hyperglycemia episodes and symptoms, medications doses, her options of short and long term treatment based on the latest standards of care / guidelines;  discussion about incorporating lifestyle medicine;  and documenting the encounter.    Please refer to Patient Instructions for Blood Glucose Monitoring and Insulin/Medications Dosing Guide"  in media tab for additional information. Please  also refer to " Patient Self Inventory" in the Media  tab  for reviewed elements of pertinent patient history.  Arna Snipe participated in the discussions, expressed understanding, and voiced agreement  with the above plans.  All questions were answered to her satisfaction. she is encouraged to contact clinic should she have any questions or concerns prior to her return visit.     Follow up plan: - Return in about 3 months (around 12/10/2021) for Diabetes F/U with A1c in office, No previsit labs, Bring meter and logs.   Rayetta Pigg, [facility] [address] [place], [place] [address] Phone: [phone] Fax: [phone]  09/09/2021, 12:41 PM

## 2021-10-09 ENCOUNTER — Telehealth: Payer: Self-pay | Admitting: Nurse Practitioner

## 2021-10-09 NOTE — Telephone Encounter (Signed)
Patient's legal guardian made aware

## 2021-10-09 NOTE — Telephone Encounter (Signed)
She has Medicare, therefore it should go to a DME company.  I will send it to [employer].  They will reach out to the patient in a few days to verify insurance, address, and then ship directly to her.

## 2021-10-09 NOTE — Telephone Encounter (Signed)
Pt's legal guardian said she is ready to move forward with getting a Dexcom G7. Please send to [facility]

## 2021-12-10 ENCOUNTER — Ambulatory Visit (INDEPENDENT_AMBULATORY_CARE_PROVIDER_SITE_OTHER): Payer: Medicare Other | Admitting: Nurse Practitioner

## 2021-12-10 ENCOUNTER — Encounter: Payer: Self-pay | Admitting: Nurse Practitioner

## 2021-12-10 VITALS — BP 178/98 | HR 67 | Ht 68.0 in | Wt 215.2 lb

## 2021-12-10 DIAGNOSIS — E782 Mixed hyperlipidemia: Secondary | ICD-10-CM | POA: Diagnosis not present

## 2021-12-10 DIAGNOSIS — E1165 Type 2 diabetes mellitus with hyperglycemia: Secondary | ICD-10-CM | POA: Diagnosis not present

## 2021-12-10 DIAGNOSIS — I10 Essential (primary) hypertension: Secondary | ICD-10-CM | POA: Diagnosis not present

## 2021-12-10 DIAGNOSIS — Z794 Long term (current) use of insulin: Secondary | ICD-10-CM | POA: Diagnosis not present

## 2021-12-10 DIAGNOSIS — Z91199 Patient's noncompliance with other medical treatment and regimen due to unspecified reason: Secondary | ICD-10-CM | POA: Diagnosis not present

## 2021-12-10 LAB — POCT GLYCOSYLATED HEMOGLOBIN (HGB A1C): Hemoglobin A1C: 10.2 % — AB (ref 4.0–5.6)

## 2021-12-10 LAB — POCT UA - MICROALBUMIN: Microalbumin Ur, POC: 30 mg/L

## 2021-12-10 NOTE — Progress Notes (Signed)
Endocrinology Follow Up Note       12/10/2021, 10:19 AM   Subjective:    Patient ID: Martha Jennings, female    DOB: [birth year].  Martha Jennings is being seen in follow up after being seen in consultation for management of currently uncontrolled symptomatic diabetes requested by  Vidal Schwalbe, MD.   Past Medical History:  Diagnosis Date   Asthma    Diabetes mellitus without complication ([place])    GERD (gastroesophageal reflux disease)    Hyperlipidemia     Past Surgical History:  Procedure Laterality Date   ABDOMINAL HYSTERECTOMY      Social History   Socioeconomic History   Marital status: Single    Spouse name: Not on file   Number of children: Not on file   Years of education: Not on file   Highest education level: Not on file  Occupational History   Not on file  Tobacco Use   Smoking status: Never   Smokeless tobacco: Never  Vaping Use   Vaping Use: Never used  Substance and Sexual Activity   Alcohol use: No   Drug use: No   Sexual activity: Not on file  Other Topics Concern   Not on file  Social History Narrative   Not on file   Social Determinants of Health   Financial Resource Strain: Not on file  Food Insecurity: Not on file  Transportation Needs: Not on file  Physical Activity: Not on file  Stress: Not on file  Social Connections: Not on file    Family History  Problem Relation Age of Onset   Cancer Mother     Outpatient Encounter Medications as of 12/10/2021  Medication Sig   amLODipine (NORVASC) 5 MG tablet Take 5 mg by mouth daily.   atorvastatin (LIPITOR) 40 MG tablet Take 40 mg by mouth daily at 6 PM.    B-D ULTRAFINE III SHORT PEN 31G X 8 MM MISC SMARTSIG:1 Each SUB-Q Daily   cloNIDine (CATAPRES) 0.1 MG tablet Take 0.1 mg by mouth daily.    glipiZIDE (GLUCOTROL) 10 MG tablet Take 1 tablet (10 mg total) by mouth 2 (two) times daily before a meal.   glucose blood (TRUE  METRIX BLOOD GLUCOSE TEST) test strip CHECK BLOOD SUGAR ONCE DAILY   insulin detemir (LEVEMIR FLEXTOUCH) 100 UNIT/ML FlexPen Inject 50 Units into the skin at bedtime.   metFORMIN (GLUCOPHAGE) 1000 MG tablet Take 1,000 mg by mouth 2 (two) times daily with a meal.    omeprazole (PRILOSEC) 20 MG capsule Take 20 mg by mouth daily.    valsartan-hydrochlorothiazide (DIOVAN-HCT) 320-25 MG tablet Take 1 tablet by mouth daily.    VENTOLIN HFA 108 (90 Base) MCG/ACT inhaler Inhale 2 puffs into the lungs every 4 (four) hours as needed.   No facility-administered encounter medications on file as of 12/10/2021.    ALLERGIES: No Known Allergies  VACCINATION STATUS:  There is no immunization history on file for this patient.  Diabetes She presents for her follow-up diabetic visit. She has type 2 diabetes mellitus. Onset time: Diagnosed at approx age of 1. Her disease course has been stable. There are no hypoglycemic associated symptoms. Associated symptoms include blurred vision,  fatigue, foot paresthesias, polydipsia and polyuria. There are no hypoglycemic complications. Symptoms are stable. Diabetic complications include nephropathy and peripheral neuropathy. Risk factors for coronary artery disease include diabetes mellitus, dyslipidemia, family history, hypertension, obesity and sedentary lifestyle. Current diabetic treatment includes insulin injections and oral agent (dual therapy). She is compliant with treatment some of the time. Her weight is fluctuating minimally. She is following a generally unhealthy diet. When asked about meal planning, she reported none. She has not had a previous visit with a dietitian. She never participates in exercise. Her overall blood glucose range is >200 mg/dl. (She presents today, accompanied by her Aunt, with her meter, no logs showing inconsistent glucose monitoring pattern.  She did receive the Dexcom but kept taking the sensors off as it was alarming high most of the  time.  Her POCT A1c today is 10.2%, essentially unchanged.  She does assure me she is taking her medication as prescribed but is not eating healthy.  Analysis of her meter shows 7-day average of 285, 14-day average of 286, 30-day average of 267.) An ACE inhibitor/angiotensin II receptor blocker is being taken. She does not see a podiatrist.Eye exam is not current.  Hypertension This is a chronic problem. The current episode started more than 1 year ago. The problem has been gradually improving since onset. The problem is controlled. Associated symptoms include blurred vision. There are no associated agents to hypertension. Risk factors for coronary artery disease include diabetes mellitus, dyslipidemia, family history, obesity and sedentary lifestyle. Past treatments include diuretics, angiotensin blockers, calcium channel blockers and alpha 1 blockers. The current treatment provides mild improvement. Compliance problems include diet, exercise and psychosocial issues.  Hypertensive end-organ damage includes kidney disease. Identifiable causes of hypertension include chronic renal disease.  Hyperlipidemia This is a chronic problem. The current episode started more than 1 year ago. The problem is controlled. Recent lipid tests were reviewed and are variable. Exacerbating diseases include chronic renal disease, diabetes and obesity. Factors aggravating her hyperlipidemia include fatty foods and thiazides. Current antihyperlipidemic treatment includes statins. The current treatment provides moderate improvement of lipids. Compliance problems include adherence to diet and adherence to exercise.  Risk factors for coronary artery disease include diabetes mellitus, dyslipidemia, family history, obesity, hypertension and a sedentary lifestyle.    Review of systems  Constitutional: +steadily increasing body weight,  current Body mass index is 32.72 kg/m. , no fatigue, no subjective hyperthermia, no subjective  hypothermia Eyes: no blurry vision, no xerophthalmia ENT: no sore throat, no nodules palpated in throat, no dysphagia/odynophagia, no hoarseness Cardiovascular: no chest pain, no shortness of breath, no palpitations, no leg swelling Respiratory: no cough, no shortness of breath Gastrointestinal: no nausea/vomiting/diarrhea Musculoskeletal: no muscle/joint aches Skin: no rashes, no hyperemia Neurological: no tremors, no numbness, no tingling, no dizziness Psychiatric: no depression, no anxiety  Objective:     BP (!) 178/98 (BP Location: Left Arm, Patient Position: Sitting, Cuff Size: Large)   Pulse 67   Ht '5\' 8"'$  (1.727 m)   Wt 215 lb 3.2 oz (97.6 kg)   BMI 32.72 kg/m   Wt Readings from Last 3 Encounters:  12/10/21 215 lb 3.2 oz (97.6 kg)  09/09/21 213 lb (96.6 kg)  06/03/21 211 lb 12.8 oz (96.1 kg)     BP Readings from Last 3 Encounters:  12/10/21 (!) 178/98  09/09/21 121/79  06/03/21 (!) 168/92     Physical Exam- Limited  Constitutional:  Body mass index is 32.72 kg/m. , not in acute distress,  unconcerned affect, avoids eye contact, ? Cognitive impairment Eyes:  EOMI, no exophthalmos Neck: Supple Cardiovascular: RRR, no murmurs, rubs, or gallops, no edema Respiratory: Adequate breathing efforts, no crackles, rales, rhonchi, or wheezing Musculoskeletal: no gross deformities, strength intact in all four extremities, no gross restriction of joint movements Skin:  no rashes, no hyperemia Neurological: no tremor with outstretched hands   Diabetic Foot Exam - Simple   No data filed     CMP ( most recent) CMP     Component Value Date/Time   NA 140 09/01/2021 0956   NA 137 07/31/2020 0000   K 4.1 09/01/2021 0956   CL 105 09/01/2021 0956   CO2 25 09/01/2021 0956   GLUCOSE 222 (H) 09/01/2021 0956   BUN 16 09/01/2021 0956   BUN 23 (A) 07/31/2020 0000   CREATININE 1.09 (H) 09/01/2021 0956   CALCIUM 9.7 09/01/2021 0956   PROT 7.0 09/01/2021 0956   ALBUMIN 4.3  07/31/2020 0000   AST 12 09/01/2021 0956   ALT 11 09/01/2021 0956   ALKPHOS 57 04/11/2017 1625   BILITOT 0.9 09/01/2021 0956   GFRNONAA 34 (L) 04/11/2017 2031   GFRAA 40 (L) 04/11/2017 2031     Diabetic Labs (most recent): Lab Results  Component Value Date   HGBA1C 10.2 (A) 12/10/2021   HGBA1C 10.1 09/09/2021   HGBA1C 12.4 (A) 06/03/2021   MICROALBUR 30 12/10/2021   MICROALBUR 30 12/03/2020     Lipid Panel ( most recent) Lipid Panel     Component Value Date/Time   CHOL 174 02/25/2021 0852   TRIG 161 (H) 02/25/2021 0852   HDL 59 02/25/2021 0852   CHOLHDL 2.9 02/25/2021 0852   LDLCALC 89 02/25/2021 0852      Lab Results  Component Value Date   TSH 2.50 11/05/2020   FREET4 1.1 11/05/2020           Assessment & Plan:   1) Uncontrolled Type 2 Diabetes with hyperglycemia:  She presents today, accompanied by her Aunt, with her meter, no logs showing inconsistent glucose monitoring pattern.  She did receive the Dexcom but kept taking the sensors off as it was alarming high most of the time.  Her POCT A1c today is 10.2%, essentially unchanged.  She does assure me she is taking her medication as prescribed but is not eating healthy.  Analysis of her meter shows 7-day average of 285, 14-day average of 286, 30-day average of 267.  - Martha Jennings has currently uncontrolled symptomatic type 2 DM since 66 years of age.  -Recent labs reviewed.  - I had a long discussion with her about the progressive nature of diabetes and the pathology behind its complications. -her diabetes is complicated by CKD stage 2 and she remains at a high risk for more acute and chronic complications which include CAD, CVA, CKD, retinopathy, and neuropathy. These are all discussed in detail with her.  - Nutritional counseling repeated at each appointment due to patients tendency to fall back in to old habits.  - The patient admits there is a room for improvement in their diet and drink choices. -   Suggestion is made for the patient to avoid simple carbohydrates from their diet including Cakes, Sweet Desserts / Pastries, Ice Cream, Soda (diet and regular), Sweet Tea, Candies, Chips, Cookies, Sweet Pastries, Store Bought Juices, Alcohol in Excess of 1-2 drinks a day, Artificial Sweeteners, Coffee Creamer, and "Sugar-free" Products. This will help patient to have stable blood glucose profile and potentially avoid  unintended weight gain.   - I encouraged the patient to switch to unprocessed or minimally processed complex starch and increased protein intake (animal or plant source), fruits, and vegetables.   - Patient is advised to stick to a routine mealtimes to eat 3 meals a day and avoid unnecessary snacks (to snack only to correct hypoglycemia).  - I have approached her with the following individualized plan to manage her diabetes and patient agrees:   -Her diabetes management is complicated by suspected noncompliance with dietary regimen and insulin injections.  -Due to lack of consistent glucose monitoring, no changes will be made to her medications today.  She is advised to continue her Levemir 50 units SQ nightly and continue Metformin 1000 mg po twice daily with meals for now (as kidneys are stable) and continue Glipizide 10 mg po twice daily with meals.   -We discussed the possibility of needing prandial insulin to help manage her diabetes but given her limited support, will try to avoid it at this time for safety reasons.  -she is encouraged to continue monitoring blood glucose at least twice daily, before breakfast and before bed, and to call the clinic if she has readings less than 70 or greater than 300 for 3 tests in a row.  She was approved and received Dexcom G7 but stopped wearing it due to the alarms going off, aunt says she didn't want me knowing what her glucose readings were and that she wasn't abiding by dietary recommendations.  - she is warned not to take insulin without  proper monitoring per orders. - Adjustment parameters are given to her for hypo and hyperglycemia in writing.  - she will be considered for incretin therapy as appropriate next visit.  - Specific targets for  A1c; LDL, HDL, and Triglycerides were discussed with the patient.  2) Blood Pressure /Hypertension:  her blood pressure is NOT controlled to target.  She has not yet taken her medications today. she is advised to continue her current medications including Norvasc 5 mg p.o. daily with breakfast, Clonidine 0.1 mg po daily, and Valsartan-HCT 320-25 mg po daily.  3) Lipids/Hyperlipidemia:    Review of her recent lipid panel from 02/25/21 showed controlled LDL at 89 and slightly elevated triglycerides of 161.  she is advised to continue Lipitor 40 mg daily at bedtime.  She is also advised to avoid fried foods and butter.  Side effects and precautions discussed with her.  Will recheck lipid panel prior to next visit.  4)  Weight/Diet:  her Body mass index is 32.72 kg/m.  -  clearly complicating her diabetes care.   she is a candidate for weight loss. I discussed with her the fact that loss of 5 - 10% of her  current body weight will have the most impact on her diabetes management.  Exercise, and detailed carbohydrates information provided  -  detailed on discharge instructions.  5) Chronic Care/Health Maintenance: -she is on ACEI/ARB and Statin medications and is encouraged to initiate and continue to follow up with Ophthalmology, Dentist, Podiatrist at least yearly or according to recommendations, and advised to stay away from smoking. I have recommended yearly flu vaccine and pneumonia vaccine at least every 5 years; moderate intensity exercise for up to 150 minutes weekly; and sleep for at least 7 hours a day.  - she is advised to maintain close follow up with Vidal Schwalbe, MD for primary care needs, as well as her other providers for optimal and coordinated care.  I spent 44 minutes  in the care of the patient today including review of labs from [facility], Lipids, Thyroid Function, Hematology (current and previous including abstractions from other facilities); face-to-face time discussing  her blood glucose readings/logs, discussing hypoglycemia and hyperglycemia episodes and symptoms, medications doses, her options of short and long term treatment based on the latest standards of care / guidelines;  discussion about incorporating lifestyle medicine;  and documenting the encounter. Risk reduction counseling performed per USPSTF guidelines to reduce obesity and cardiovascular risk factors.     Please refer to Patient Instructions for Blood Glucose Monitoring and Insulin/Medications Dosing Guide"  in media tab for additional information. Please  also refer to " Patient Self Inventory" in the Media  tab for reviewed elements of pertinent patient history.  Martha Jennings participated in the discussions, expressed understanding, and voiced agreement with the above plans.  All questions were answered to her satisfaction. she is encouraged to contact clinic should she have any questions or concerns prior to her return visit.     Follow up plan: - Return in about 3 months (around 03/11/2022) for Diabetes F/U with A1c in office, Previsit labs, Bring meter and logs.   Martha Jennings, [facility] [address] [place], [place] [address] Phone: [phone] Fax: [phone]  12/10/2021, 10:19 AM

## 2022-03-12 ENCOUNTER — Ambulatory Visit: Payer: Medicare Other | Admitting: Nurse Practitioner

## 2022-03-24 LAB — COMPREHENSIVE METABOLIC PANEL
ALT: 12 IU/L (ref 0–32)
AST: 10 IU/L (ref 0–40)
Albumin/Globulin Ratio: 1.5 (ref 1.2–2.2)
Albumin: 4.4 g/dL (ref 3.9–4.9)
Alkaline Phosphatase: 97 IU/L (ref 44–121)
BUN/Creatinine Ratio: 19 (ref 12–28)
BUN: 22 mg/dL (ref 8–27)
Bilirubin Total: 0.7 mg/dL (ref 0.0–1.2)
CO2: 22 mmol/L (ref 20–29)
Calcium: 10.3 mg/dL (ref 8.7–10.3)
Chloride: 102 mmol/L (ref 96–106)
Creatinine, Ser: 1.18 mg/dL — ABNORMAL HIGH (ref 0.57–1.00)
Globulin, Total: 3 g/dL (ref 1.5–4.5)
Glucose: 245 mg/dL — ABNORMAL HIGH (ref 70–99)
Potassium: 4 mmol/L (ref 3.5–5.2)
Sodium: 140 mmol/L (ref 134–144)
Total Protein: 7.4 g/dL (ref 6.0–8.5)
eGFR: 51 mL/min/{1.73_m2} — ABNORMAL LOW (ref >59)

## 2022-03-24 LAB — T4, FREE: Free T4: 1.26 ng/dL (ref 0.82–1.77)

## 2022-03-24 LAB — LIPID PANEL
Chol/HDL Ratio: 3.2 ratio (ref 0.0–4.4)
Cholesterol, Total: 169 mg/dL (ref 100–199)
HDL: 53 mg/dL (ref >39)
LDL Chol Calc (NIH): 91 mg/dL (ref 0–99)
Triglycerides: 143 mg/dL (ref 0–149)
VLDL Cholesterol Cal: 25 mg/dL (ref 5–40)

## 2022-03-24 LAB — TSH: TSH: 2.3 u[IU]/mL (ref 0.450–4.500)

## 2022-03-31 ENCOUNTER — Encounter: Payer: Self-pay | Admitting: Nurse Practitioner

## 2022-03-31 ENCOUNTER — Ambulatory Visit (INDEPENDENT_AMBULATORY_CARE_PROVIDER_SITE_OTHER): Payer: Medicare Other | Admitting: Nurse Practitioner

## 2022-03-31 VITALS — BP 138/80 | HR 73 | Ht 68.0 in | Wt 214.4 lb

## 2022-03-31 DIAGNOSIS — E782 Mixed hyperlipidemia: Secondary | ICD-10-CM | POA: Diagnosis not present

## 2022-03-31 DIAGNOSIS — Z91199 Patient's noncompliance with other medical treatment and regimen due to unspecified reason: Secondary | ICD-10-CM

## 2022-03-31 DIAGNOSIS — I10 Essential (primary) hypertension: Secondary | ICD-10-CM | POA: Diagnosis not present

## 2022-03-31 DIAGNOSIS — E1165 Type 2 diabetes mellitus with hyperglycemia: Secondary | ICD-10-CM

## 2022-03-31 DIAGNOSIS — Z794 Long term (current) use of insulin: Secondary | ICD-10-CM

## 2022-03-31 LAB — POCT GLYCOSYLATED HEMOGLOBIN (HGB A1C): Hemoglobin A1C: 10.8 % — AB (ref 4.0–5.6)

## 2022-03-31 NOTE — Progress Notes (Signed)
Endocrinology Follow Up Note       03/31/2022, 9:41 AM   Subjective:    Patient ID: Martha Jennings, female    DOB: November 07, 1955.  Martha Jennings is being seen in follow up after being seen in consultation for management of currently uncontrolled symptomatic diabetes requested by  Vidal Schwalbe, MD.   Past Medical History:  Diagnosis Date   Asthma    Diabetes mellitus without complication ([place])    GERD (gastroesophageal reflux disease)    Hyperlipidemia     Past Surgical History:  Procedure Laterality Date   ABDOMINAL HYSTERECTOMY      Social History   Socioeconomic History   Marital status: Single    Spouse name: Not on file   Number of children: Not on file   Years of education: Not on file   Highest education level: Not on file  Occupational History   Not on file  Tobacco Use   Smoking status: Never   Smokeless tobacco: Never  Vaping Use   Vaping Use: Never used  Substance and Sexual Activity   Alcohol use: No   Drug use: No   Sexual activity: Not on file  Other Topics Concern   Not on file  Social History Narrative   Not on file   Social Determinants of Health   Financial Resource Strain: Not on file  Food Insecurity: Not on file  Transportation Needs: Not on file  Physical Activity: Not on file  Stress: Not on file  Social Connections: Not on file    Family History  Problem Relation Age of Onset   Cancer Mother     Outpatient Encounter Medications as of 03/31/2022  Medication Sig   amLODipine (NORVASC) 5 MG tablet Take 5 mg by mouth daily.   atorvastatin (LIPITOR) 40 MG tablet Take 40 mg by mouth daily at 6 PM.    B-D ULTRAFINE III SHORT PEN 31G X 8 MM MISC SMARTSIG:1 Each SUB-Q Daily   cloNIDine (CATAPRES) 0.1 MG tablet Take 0.1 mg by mouth daily.    glipiZIDE (GLUCOTROL) 10 MG tablet Take 1 tablet (10 mg total) by mouth 2 (two) times daily before a meal.   glucose blood (TRUE  METRIX BLOOD GLUCOSE TEST) test strip CHECK BLOOD SUGAR ONCE DAILY   insulin detemir (LEVEMIR FLEXTOUCH) 100 UNIT/ML FlexPen Inject 50 Units into the skin at bedtime.   metFORMIN (GLUCOPHAGE) 1000 MG tablet Take 1,000 mg by mouth 2 (two) times daily with a meal.    omeprazole (PRILOSEC) 20 MG capsule Take 20 mg by mouth daily.    valsartan-hydrochlorothiazide (DIOVAN-HCT) 320-25 MG tablet Take 1 tablet by mouth daily.    VENTOLIN HFA 108 (90 Base) MCG/ACT inhaler Inhale 2 puffs into the lungs every 4 (four) hours as needed.   No facility-administered encounter medications on file as of 03/31/2022.    ALLERGIES: No Known Allergies  VACCINATION STATUS:  There is no immunization history on file for this patient.  Diabetes She presents for her follow-up diabetic visit. She has type 2 diabetes mellitus. Onset time: Diagnosed at approx age of 60. Her disease course has been worsening. There are no hypoglycemic associated symptoms. Associated symptoms include blurred vision,  fatigue, foot paresthesias, polydipsia and polyuria. There are no hypoglycemic complications. Symptoms are stable. Diabetic complications include nephropathy and peripheral neuropathy. Risk factors for coronary artery disease include diabetes mellitus, dyslipidemia, family history, hypertension, obesity and sedentary lifestyle. Current diabetic treatment includes insulin injections and oral agent (dual therapy). She is compliant with treatment some of the time (she has only been taking 20 units of her insulin at night). Her weight is fluctuating minimally. She is following a generally unhealthy diet. When asked about meal planning, she reported none. She has not had a previous visit with a dietitian. She never participates in exercise. Her overall blood glucose range is >200 mg/dl. (She presents today, accompanied by her Aunt, with her meter, no logs showing inconsistent glucose monitoring pattern.  Analysis of her meter shows 7, 14,  and 30-day averages of 280.  She did not monitor at all in the month of December.  Her POCT A1c today is 10.8%, increasing from last visit of 10.2%.  ) An ACE inhibitor/angiotensin II receptor blocker is being taken. She does not see a podiatrist.Eye exam is not current.  Hypertension This is a chronic problem. The current episode started more than 1 year ago. The problem has been gradually improving since onset. The problem is controlled. Associated symptoms include blurred vision. There are no associated agents to hypertension. Risk factors for coronary artery disease include diabetes mellitus, dyslipidemia, family history, obesity and sedentary lifestyle. Past treatments include diuretics, angiotensin blockers, calcium channel blockers and alpha 1 blockers. The current treatment provides mild improvement. Compliance problems include diet, exercise and psychosocial issues.  Hypertensive end-organ damage includes kidney disease. Identifiable causes of hypertension include chronic renal disease.  Hyperlipidemia This is a chronic problem. The current episode started more than 1 year ago. The problem is controlled. Recent lipid tests were reviewed and are variable. Exacerbating diseases include chronic renal disease, diabetes and obesity. Factors aggravating her hyperlipidemia include fatty foods and thiazides. Current antihyperlipidemic treatment includes statins. The current treatment provides moderate improvement of lipids. Compliance problems include adherence to diet and adherence to exercise.  Risk factors for coronary artery disease include diabetes mellitus, dyslipidemia, family history, obesity, hypertension and a sedentary lifestyle.    Review of systems  Constitutional: +sable body weight,  current Body mass index is 32.6 kg/m. , no fatigue, no subjective hyperthermia, no subjective hypothermia Eyes: no blurry vision, no xerophthalmia ENT: no sore throat, no nodules palpated in throat, no  dysphagia/odynophagia, no hoarseness Cardiovascular: no chest pain, no shortness of breath, no palpitations, no leg swelling Respiratory: no cough, no shortness of breath Gastrointestinal: no nausea/vomiting/diarrhea Musculoskeletal: no muscle/joint aches Skin: no rashes, no hyperemia Neurological: no tremors, no numbness, no tingling, no dizziness Psychiatric: no depression, no anxiety  Objective:     BP 138/80 (BP Location: Right Arm, Patient Position: Sitting, Cuff Size: Large) Comment: Retaken with manuel BP cuff  Pulse 73   Ht '5\' 8"'$  (1.727 m)   Wt 214 lb 6.4 oz (97.3 kg)   BMI 32.60 kg/m   Wt Readings from Last 3 Encounters:  03/31/22 214 lb 6.4 oz (97.3 kg)  12/10/21 215 lb 3.2 oz (97.6 kg)  09/09/21 213 lb (96.6 kg)     BP Readings from Last 3 Encounters:  03/31/22 138/80  12/10/21 (!) 178/98  09/09/21 121/79     Physical Exam- Limited  Constitutional:  Body mass index is 32.6 kg/m. , not in acute distress, unconcerned affect, avoids eye contact, ? Cognitive impairment Eyes:  EOMI,  no exophthalmos Musculoskeletal: no gross deformities, strength intact in all four extremities, no gross restriction of joint movements Skin:  no rashes, no hyperemia Neurological: no tremor with outstretched hands   Diabetic Foot Exam - Simple   No data filed     CMP ( most recent) CMP     Component Value Date/Time   NA 140 03/23/2022 0943   K 4.0 03/23/2022 0943   CL 102 03/23/2022 0943   CO2 22 03/23/2022 0943   GLUCOSE 245 (H) 03/23/2022 0943   GLUCOSE 222 (H) 09/01/2021 0956   BUN 22 03/23/2022 0943   CREATININE 1.18 (H) 03/23/2022 0943   CREATININE 1.09 (H) 09/01/2021 0956   CALCIUM 10.3 03/23/2022 0943   PROT 7.4 03/23/2022 0943   ALBUMIN 4.4 03/23/2022 0943   AST 10 03/23/2022 0943   ALT 12 03/23/2022 0943   ALKPHOS 97 03/23/2022 0943   BILITOT 0.7 03/23/2022 0943   GFRNONAA 34 (L) 04/11/2017 2031   GFRAA 40 (L) 04/11/2017 2031     Diabetic Labs (most  recent): Lab Results  Component Value Date   HGBA1C 10.8 (A) 03/31/2022   HGBA1C 10.2 (A) 12/10/2021   HGBA1C 10.1 09/09/2021   MICROALBUR 30 12/10/2021   MICROALBUR 30 12/03/2020     Lipid Panel ( most recent) Lipid Panel     Component Value Date/Time   CHOL 169 03/23/2022 0943   TRIG 143 03/23/2022 0943   HDL 53 03/23/2022 0943   CHOLHDL 3.2 03/23/2022 0943   CHOLHDL 2.9 02/25/2021 0852   LDLCALC 91 03/23/2022 0943   LDLCALC 89 02/25/2021 0852   LABVLDL 25 03/23/2022 0943      Lab Results  Component Value Date   TSH 2.300 03/23/2022   TSH 2.50 11/05/2020   FREET4 1.26 03/23/2022   FREET4 1.1 11/05/2020           Assessment & Plan:   1) Uncontrolled Type 2 Diabetes with hyperglycemia:  She presents today, accompanied by her Aunt, with her meter, no logs showing inconsistent glucose monitoring pattern.  Analysis of her meter shows 7, 14, and 30-day averages of 280.  She did not monitor at all in the month of December.  Her POCT A1c today is 10.8%, increasing from last visit of 10.2%.    - Martha Jennings has currently uncontrolled symptomatic type 2 DM since 67 years of age.  -Recent labs reviewed.  - I had a long discussion with her about the progressive nature of diabetes and the pathology behind its complications. -her diabetes is complicated by CKD stage 2 and she remains at a high risk for more acute and chronic complications which include CAD, CVA, CKD, retinopathy, and neuropathy. These are all discussed in detail with her.  - Nutritional counseling repeated at each appointment due to patients tendency to fall back in to old habits.  - The patient admits there is a room for improvement in their diet and drink choices. -  Suggestion is made for the patient to avoid simple carbohydrates from their diet including Cakes, Sweet Desserts / Pastries, Ice Cream, Soda (diet and regular), Sweet Tea, Candies, Chips, Cookies, Sweet Pastries, Store Bought Juices, Alcohol  in Excess of 1-2 drinks a day, Artificial Sweeteners, Coffee Creamer, and "Sugar-free" Products. This will help patient to have stable blood glucose profile and potentially avoid unintended weight gain.   - I encouraged the patient to switch to unprocessed or minimally processed complex starch and increased protein intake (animal or plant source), fruits, and  vegetables.   - Patient is advised to stick to a routine mealtimes to eat 3 meals a day and avoid unnecessary snacks (to snack only to correct hypoglycemia).  - I have approached her with the following individualized plan to manage her diabetes and patient agrees:   -Her diabetes management is complicated by suspected noncompliance with dietary regimen and insulin injections.  -Due to lack of consistent glucose monitoring, no changes will be made to her medications today.  She is advised to continue her Levemir 50 units SQ nightly (had only been taking 20 units) and continue Metformin 1000 mg po twice daily with meals for now (may reduce or discontinue at next visit due to steady decline in kidney function) and continue Glipizide 10 mg po twice daily with meals.   -We discussed the possibility of needing prandial insulin to help manage her diabetes but given her limited support, will try to avoid it at this time for safety reasons.  -she is encouraged to continue monitoring blood glucose at least twice daily, before breakfast and before bed, and to call the clinic if she has readings less than 70 or greater than 300 for 3 tests in a row.  She was approved and received Dexcom G7 but stopped wearing it due to the alarms going off, aunt says she didn't want me knowing what her glucose readings were and that she wasn't abiding by dietary recommendations.  - she is warned not to take insulin without proper monitoring per orders. - Adjustment parameters are given to her for hypo and hyperglycemia in writing.  - she will be considered for incretin  therapy as appropriate next visit.  - Specific targets for  A1c; LDL, HDL, and Triglycerides were discussed with the patient.  2) Blood Pressure /Hypertension:  her blood pressure is controlled to target.  she is advised to continue her current medications including Norvasc 5 mg p.o. daily with breakfast, Clonidine 0.1 mg po daily, and Valsartan-HCT 320-25 mg po daily.  3) Lipids/Hyperlipidemia:    Review of her recent lipid panel from 03/23/22 showed controlled LDL at 91.  she is advised to continue Lipitor 40 mg daily at bedtime.  She is also advised to avoid fried foods and butter.  Side effects and precautions discussed with her.    4)  Weight/Diet:  her Body mass index is 32.6 kg/m.  -  clearly complicating her diabetes care.   she is a candidate for weight loss. I discussed with her the fact that loss of 5 - 10% of her  current body weight will have the most impact on her diabetes management.  Exercise, and detailed carbohydrates information provided  -  detailed on discharge instructions.  5) Chronic Care/Health Maintenance: -she is on ACEI/ARB and Statin medications and is encouraged to initiate and continue to follow up with Ophthalmology, Dentist, Podiatrist at least yearly or according to recommendations, and advised to stay away from smoking. I have recommended yearly flu vaccine and pneumonia vaccine at least every 5 years; moderate intensity exercise for up to 150 minutes weekly; and sleep for at least 7 hours a day.  - she is advised to maintain close follow up with Vidal Schwalbe, MD for primary care needs, as well as her other providers for optimal and coordinated care.      I spent 30 minutes in the care of the patient today including review of labs from [facility], Lipids, Thyroid Function, Hematology (current and previous including abstractions from other facilities); face-to-face time  discussing  her blood glucose readings/logs, discussing hypoglycemia and hyperglycemia episodes  and symptoms, medications doses, her options of short and long term treatment based on the latest standards of care / guidelines;  discussion about incorporating lifestyle medicine;  and documenting the encounter. Risk reduction counseling performed per USPSTF guidelines to reduce obesity and cardiovascular risk factors.     Please refer to Patient Instructions for Blood Glucose Monitoring and Insulin/Medications Dosing Guide"  in media tab for additional information. Please  also refer to " Patient Self Inventory" in the Media  tab for reviewed elements of pertinent patient history.  Martha Jennings participated in the discussions, expressed understanding, and voiced agreement with the above plans.  All questions were answered to her satisfaction. she is encouraged to contact clinic should she have any questions or concerns prior to her return visit.     Follow up plan: - Return in about 3 months (around 06/30/2022) for Diabetes F/U with A1c in office, Previsit labs, Bring meter and logs.  Rayetta Pigg, [facility] [address] [place], [place] [address] Phone: [phone] Fax: [phone]  03/31/2022, 9:41 AM

## 2022-06-25 LAB — COMPREHENSIVE METABOLIC PANEL
ALT: 15 IU/L (ref 0–32)
AST: 13 IU/L (ref 0–40)
Albumin/Globulin Ratio: 1.4 (ref 1.2–2.2)
Albumin: 4.4 g/dL (ref 3.9–4.9)
Alkaline Phosphatase: 98 IU/L (ref 44–121)
BUN/Creatinine Ratio: 13 (ref 12–28)
BUN: 16 mg/dL (ref 8–27)
Bilirubin Total: 0.6 mg/dL (ref 0.0–1.2)
CO2: 21 mmol/L (ref 20–29)
Calcium: 9.9 mg/dL (ref 8.7–10.3)
Chloride: 100 mmol/L (ref 96–106)
Creatinine, Ser: 1.19 mg/dL — ABNORMAL HIGH (ref 0.57–1.00)
Globulin, Total: 3.1 g/dL (ref 1.5–4.5)
Glucose: 352 mg/dL — ABNORMAL HIGH (ref 70–99)
Potassium: 3.8 mmol/L (ref 3.5–5.2)
Sodium: 139 mmol/L (ref 134–144)
Total Protein: 7.5 g/dL (ref 6.0–8.5)
eGFR: 50 mL/min/{1.73_m2} — ABNORMAL LOW (ref >59)

## 2022-06-30 ENCOUNTER — Ambulatory Visit: Payer: 59 | Admitting: Nurse Practitioner

## 2022-06-30 DIAGNOSIS — E1165 Type 2 diabetes mellitus with hyperglycemia: Secondary | ICD-10-CM

## 2022-06-30 DIAGNOSIS — I10 Essential (primary) hypertension: Secondary | ICD-10-CM

## 2022-06-30 DIAGNOSIS — E782 Mixed hyperlipidemia: Secondary | ICD-10-CM

## 2022-06-30 DIAGNOSIS — Z91199 Patient's noncompliance with other medical treatment and regimen due to unspecified reason: Secondary | ICD-10-CM

## 2022-07-21 ENCOUNTER — Telehealth: Payer: Self-pay | Admitting: Nurse Practitioner

## 2022-07-21 NOTE — Telephone Encounter (Signed)
Ok, sounds good.

## 2022-07-21 NOTE — Telephone Encounter (Signed)
Patient's legal guardian called and canceled her appt for tomorrow. She states she is not taking any of her medication. She is taking her to see her PCP next week and will discuss options hen for her.

## 2022-07-22 ENCOUNTER — Ambulatory Visit: Payer: 59 | Admitting: Nurse Practitioner

## 2022-07-22 DIAGNOSIS — Z91199 Patient's noncompliance with other medical treatment and regimen due to unspecified reason: Secondary | ICD-10-CM

## 2022-07-22 DIAGNOSIS — E782 Mixed hyperlipidemia: Secondary | ICD-10-CM

## 2022-07-22 DIAGNOSIS — E1165 Type 2 diabetes mellitus with hyperglycemia: Secondary | ICD-10-CM

## 2022-07-22 DIAGNOSIS — I10 Essential (primary) hypertension: Secondary | ICD-10-CM

## 2022-11-23 ENCOUNTER — Other Ambulatory Visit (HOSPITAL_COMMUNITY): Payer: Self-pay | Admitting: Internal Medicine

## 2022-11-23 DIAGNOSIS — Z1231 Encounter for screening mammogram for malignant neoplasm of breast: Secondary | ICD-10-CM

## 2022-12-02 ENCOUNTER — Ambulatory Visit (HOSPITAL_COMMUNITY): Payer: 59

## 2023-01-12 ENCOUNTER — Ambulatory Visit (HOSPITAL_COMMUNITY)
Admission: RE | Admit: 2023-01-12 | Discharge: 2023-01-12 | Disposition: A | Discharge status: Home / Self Care | Payer: 59 | Source: Ambulatory Visit | Attending: Internal Medicine | Admitting: Internal Medicine

## 2023-01-12 DIAGNOSIS — Z1231 Encounter for screening mammogram for malignant neoplasm of breast: Secondary | ICD-10-CM | POA: Insufficient documentation

## 2023-04-14 LAB — HEMOGLOBIN A1C: Hemoglobin A1C: 10.8

## 2023-04-18 ENCOUNTER — Encounter: Payer: Self-pay | Admitting: Nurse Practitioner

## 2023-04-18 ENCOUNTER — Other Ambulatory Visit (HOSPITAL_COMMUNITY): Payer: Self-pay | Admitting: Internal Medicine

## 2023-04-18 ENCOUNTER — Ambulatory Visit (INDEPENDENT_AMBULATORY_CARE_PROVIDER_SITE_OTHER): Payer: 59 | Admitting: Nurse Practitioner

## 2023-04-18 VITALS — BP 119/78 | HR 80 | Ht 73.0 in | Wt 208.8 lb

## 2023-04-18 DIAGNOSIS — I10 Essential (primary) hypertension: Secondary | ICD-10-CM | POA: Diagnosis not present

## 2023-04-18 DIAGNOSIS — Z794 Long term (current) use of insulin: Secondary | ICD-10-CM | POA: Diagnosis not present

## 2023-04-18 DIAGNOSIS — E119 Type 2 diabetes mellitus without complications: Secondary | ICD-10-CM

## 2023-04-18 DIAGNOSIS — E1165 Type 2 diabetes mellitus with hyperglycemia: Secondary | ICD-10-CM | POA: Diagnosis not present

## 2023-04-18 DIAGNOSIS — Z91199 Patient's noncompliance with other medical treatment and regimen due to unspecified reason: Secondary | ICD-10-CM | POA: Diagnosis not present

## 2023-04-18 DIAGNOSIS — I739 Peripheral vascular disease, unspecified: Secondary | ICD-10-CM

## 2023-04-18 DIAGNOSIS — Z7984 Long term (current) use of oral hypoglycemic drugs: Secondary | ICD-10-CM

## 2023-04-18 MED ORDER — METFORMIN HCL 1000 MG PO TABS: 1000.0000 mg | ORAL_TABLET | Freq: Two times a day (BID) | ORAL | 3 refills | Status: DC

## 2023-04-18 MED ORDER — TRESIBA FLEXTOUCH 100 UNIT/ML ~~LOC~~ SOPN: 20.0000 [IU] | PEN_INJECTOR | Freq: Every day | SUBCUTANEOUS | 3 refills | Status: DC

## 2023-04-18 MED ORDER — GLIPIZIDE 10 MG PO TABS: 10.0000 mg | ORAL_TABLET | Freq: Two times a day (BID) | ORAL | 3 refills | Status: DC

## 2023-04-18 NOTE — Progress Notes (Signed)
Endocrinology Follow Up Note       04/18/2023, 3:13 PM   Subjective:    Patient ID: Martha Jennings, female    DOB: [birth year].  Martha Jennings is being seen in follow up after being seen in consultation for management of currently uncontrolled symptomatic diabetes requested by  Martha Robert, MD.   Past Medical History:  Diagnosis Date   Asthma    Diabetes mellitus without complication (HCC)    GERD (gastroesophageal reflux disease)    Hyperlipidemia     Past Surgical History:  Procedure Laterality Date   ABDOMINAL HYSTERECTOMY      Social History   Socioeconomic History   Marital status: Single    Spouse name: Not on file   Number of children: Not on file   Years of education: Not on file   Highest education level: Not on file  Occupational History   Not on file  Tobacco Use   Smoking status: Never   Smokeless tobacco: Never  Vaping Use   Vaping status: Never Used  Substance and Sexual Activity   Alcohol use: No   Drug use: No   Sexual activity: Not on file  Other Topics Concern   Not on file  Social History Narrative   Not on file   Social Drivers of Health   Financial Resource Strain: Not on file  Food Insecurity: Not on file  Transportation Needs: Not on file  Physical Activity: Not on file  Stress: Not on file  Social Connections: Not on file    Family History  Problem Relation Age of Onset   Cancer Mother     Outpatient Encounter Medications as of 04/18/2023  Medication Sig   amLODipine (NORVASC) 5 MG tablet Take 5 mg by mouth daily.   atorvastatin (LIPITOR) 40 MG tablet Take 40 mg by mouth daily at 6 PM.    B-D ULTRAFINE III SHORT PEN 31G X 8 MM MISC SMARTSIG:1 Each SUB-Q Daily   cloNIDine (CATAPRES) 0.1 MG tablet Take 0.1 mg by mouth daily.    glucose blood (TRUE METRIX BLOOD GLUCOSE TEST) test strip CHECK BLOOD SUGAR ONCE DAILY   insulin degludec (TRESIBA FLEXTOUCH) 100  UNIT/ML FlexTouch Pen Inject 20 Units into the skin at bedtime.   omeprazole (PRILOSEC) 20 MG capsule Take 20 mg by mouth daily.    valsartan-hydrochlorothiazide (DIOVAN-HCT) 320-25 MG tablet Take 1 tablet by mouth daily.    VENTOLIN HFA 108 (90 Base) MCG/ACT inhaler Inhale 2 puffs into the lungs every 4 (four) hours as needed.   [DISCONTINUED] glipiZIDE (GLUCOTROL) 10 MG tablet Take 1 tablet (10 mg total) by mouth 2 (two) times daily before a meal.   [DISCONTINUED] insulin detemir (LEVEMIR FLEXTOUCH) 100 UNIT/ML FlexPen Inject 50 Units into the skin at bedtime.   [DISCONTINUED] metFORMIN (GLUCOPHAGE) 1000 MG tablet Take 1,000 mg by mouth 2 (two) times daily with a meal.    glipiZIDE (GLUCOTROL) 10 MG tablet Take 1 tablet (10 mg total) by mouth 2 (two) times daily before a meal.   metFORMIN (GLUCOPHAGE) 1000 MG tablet Take 1 tablet (1,000 mg total) by mouth 2 (two) times daily with a meal.   No facility-administered encounter medications on  file as of 04/18/2023.    ALLERGIES: No Known Allergies  VACCINATION STATUS:  There is no immunization history on file for this patient.  Diabetes She presents for her follow-up diabetic visit. She has type 2 diabetes mellitus. Onset time: Diagnosed at approx age of 13. Her disease course has been stable. There are no hypoglycemic associated symptoms. Associated symptoms include blurred vision, fatigue, foot paresthesias, polydipsia and polyuria. There are no hypoglycemic complications. Symptoms are stable. Diabetic complications include nephropathy and peripheral neuropathy. Risk factors for coronary artery disease include diabetes mellitus, dyslipidemia, family history, hypertension, obesity and sedentary lifestyle. Current diabetic treatment includes insulin injections and oral agent (dual therapy). She is compliant with treatment some of the time (has not been taking her insulin (was expired)). Her weight is fluctuating minimally. She is following a  generally unhealthy diet. When asked about meal planning, she reported none. She has not had a previous visit with a dietitian. She never participates in exercise. Her overall blood glucose range is >200 mg/dl. (She presents today, accompanied by her family member, with her meter, no logs showing inconsistent glucose monitoring pattern.  Analysis of her meter shows 7 and 14-day average of 261, and 30-day averages of 276.   Her most recent A1c, checked by [facility] on 04/14/23 was 10.8%, unchanged from last visit.  Her family member says the patient has been drinking lots of sodas, juices, and teas.  She has not taken any insulin in a long time.  Says the Levemir she had in the fridge was expired and she threw it away.  Her family member is unable to visit everyday to ensure she is taking her medications.  ) An ACE inhibitor/angiotensin II receptor blocker is being taken. She does not see a podiatrist.Eye exam is not current.  Hypertension This is a chronic problem. The current episode started more than 1 year ago. The problem has been gradually improving since onset. The problem is controlled. Associated symptoms include blurred vision. There are no associated agents to hypertension. Risk factors for coronary artery disease include diabetes mellitus, dyslipidemia, family history, obesity and sedentary lifestyle. Past treatments include diuretics, angiotensin blockers, calcium channel blockers and alpha 1 blockers. The current treatment provides mild improvement. Compliance problems include diet, exercise and psychosocial issues.  Hypertensive end-organ damage includes kidney disease. Identifiable causes of hypertension include chronic renal disease.  Hyperlipidemia This is a chronic problem. The current episode started more than 1 year ago. The problem is controlled. Recent lipid tests were reviewed and are variable. Exacerbating diseases include chronic renal disease, diabetes and obesity. Factors aggravating her  hyperlipidemia include fatty foods and thiazides. Current antihyperlipidemic treatment includes statins. The current treatment provides moderate improvement of lipids. Compliance problems include adherence to diet and adherence to exercise.  Risk factors for coronary artery disease include diabetes mellitus, dyslipidemia, family history, obesity, hypertension and a sedentary lifestyle.    Review of systems  Constitutional: + decreasing body weight,  current Body mass index is 27.55 kg/m. , no fatigue, no subjective hyperthermia, no subjective hypothermia Eyes: no blurry vision, no xerophthalmia ENT: no sore throat, no nodules palpated in throat, no dysphagia/odynophagia, no hoarseness Cardiovascular: no chest pain, no shortness of breath, no palpitations, no leg swelling Respiratory: no cough, no shortness of breath Gastrointestinal: no nausea/vomiting/diarrhea Musculoskeletal: no muscle/joint aches Skin: no rashes, no hyperemia Neurological: no tremors, no numbness, no tingling, no dizziness Psychiatric: no depression, no anxiety  Objective:     BP 119/78 (BP Location: Right Arm,  Patient Position: Sitting, Cuff Size: Large)   Pulse 80   Ht 6\' 1"  (1.854 m)   Wt 208 lb 12.8 oz (94.7 kg)   BMI 27.55 kg/m   Wt Readings from Last 3 Encounters:  04/18/23 208 lb 12.8 oz (94.7 kg)  03/31/22 214 lb 6.4 oz (97.3 kg)  12/10/21 215 lb 3.2 oz (97.6 kg)     BP Readings from Last 3 Encounters:  04/18/23 119/78  03/31/22 138/80  12/10/21 (!) 178/98     Physical Exam- Limited  Constitutional:  Body mass index is 27.55 kg/m. , not in acute distress, unconcerned affect, avoids eye contact, ? Cognitive impairment Eyes:  EOMI, no exophthalmos Musculoskeletal: no gross deformities, strength intact in all four extremities, no gross restriction of joint movements Skin:  no rashes, no hyperemia Neurological: no tremor with outstretched hands   Diabetic Foot Exam - Simple   No data filed      CMP ( most recent) CMP     Component Value Date/Time   NA 139 06/24/2022 0933   K 3.8 06/24/2022 0933   CL 100 06/24/2022 0933   CO2 21 06/24/2022 0933   GLUCOSE 352 (H) 06/24/2022 0933   GLUCOSE 222 (H) 09/01/2021 0956   BUN 16 06/24/2022 0933   CREATININE 1.19 (H) 06/24/2022 0933   CREATININE 1.09 (H) 09/01/2021 0956   CALCIUM 9.9 06/24/2022 0933   PROT 7.5 06/24/2022 0933   ALBUMIN 4.4 06/24/2022 0933   AST 13 06/24/2022 0933   ALT 15 06/24/2022 0933   ALKPHOS 98 06/24/2022 0933   BILITOT 0.6 06/24/2022 0933   GFRNONAA 34 (L) 04/11/2017 2031   GFRAA 40 (L) 04/11/2017 2031     Diabetic Labs (most recent): Lab Results  Component Value Date   HGBA1C 10.8 04/14/2023   HGBA1C 10.8 (A) 03/31/2022   HGBA1C 10.2 (A) 12/10/2021   MICROALBUR 30 12/10/2021   MICROALBUR 30 12/03/2020     Lipid Panel ( most recent) Lipid Panel     Component Value Date/Time   CHOL 169 03/23/2022 0943   TRIG 143 03/23/2022 0943   HDL 53 03/23/2022 0943   CHOLHDL 3.2 03/23/2022 0943   CHOLHDL 2.9 02/25/2021 0852   LDLCALC 91 03/23/2022 0943   LDLCALC 89 02/25/2021 0852   LABVLDL 25 03/23/2022 0943      Lab Results  Component Value Date   TSH 2.300 03/23/2022   TSH 2.50 11/05/2020   FREET4 1.26 03/23/2022   FREET4 1.1 11/05/2020           Assessment & Plan:   1) Uncontrolled Type 2 Diabetes with hyperglycemia:  She presents today, accompanied by her family member, with her meter, no logs showing inconsistent glucose monitoring pattern.  Analysis of her meter shows 7 and 14-day average of 261, and 30-day averages of 276.   Her most recent A1c, [facility] by [facility] on 04/14/23 was 10.8%, unchanged from last visit.  Her family member says the patient has been drinking lots of sodas, juices, and teas.  She has not taken any insulin in a long time.  Says the Levemir she had in the fridge was expired and she threw it away.  Her family member is unable to visit everyday to ensure she is  taking her medications.    - Martha Jennings has currently uncontrolled symptomatic type 2 DM since 68 years of age.  -Recent labs reviewed.  - I had a long discussion with her about the progressive nature of diabetes and the  pathology behind its complications. -her diabetes is complicated by CKD stage 3a and she remains at a high risk for more acute and chronic complications which include CAD, CVA, CKD, retinopathy, and neuropathy. These are all discussed in detail with her.  - Nutritional counseling repeated at each appointment due to patients tendency to fall back in to old habits.  - The patient admits there is a room for improvement in their diet and drink choices. -  Suggestion is made for the patient to avoid simple carbohydrates from their diet including Cakes, Sweet Desserts / Pastries, Ice Cream, Soda (diet and regular), Sweet Tea, Candies, Chips, Cookies, Sweet Pastries, Store Bought Juices, Alcohol in Excess of 1-2 drinks a day, Artificial Sweeteners, Coffee Creamer, and "Sugar-free" Products. This will help patient to have stable blood glucose profile and potentially avoid unintended weight gain.   - I encouraged the patient to switch to unprocessed or minimally processed complex starch and increased protein intake (animal or plant source), fruits, and vegetables.   - Patient is advised to stick to a routine mealtimes to eat 3 meals a day and avoid unnecessary snacks (to snack only to correct hypoglycemia).  - I have approached her with the following individualized plan to manage her diabetes and patient agrees:   -Her diabetes management is complicated by suspected noncompliance with dietary regimen and insulin injections.  -I did restart basal insulin with Treaiba 20 units SQ nightly.  I asked if patients family member can call each day to remind patient to take it.  She can continue Metformin 1000 mg po twice daily with meals for now (may reduce or discontinue at next visit due to  steady decline in kidney function) and continue Glipizide 10 mg po twice daily with meals.   -We discussed the possibility of needing prandial insulin to help manage her diabetes but given her limited support, will try to avoid it at this time for safety reasons.  -she is encouraged to continue monitoring blood glucose at least twice daily, before breakfast and before bed, and to call the clinic if she has readings less than 70 or greater than 300 for 3 tests in a row.  She did not care for CGM, tried it in the past.  - she is warned not to take insulin without proper monitoring per orders. - Adjustment parameters are given to her for hypo and hyperglycemia in writing.  - Specific targets for  A1c; LDL, HDL, and Triglycerides were discussed with the patient.  2) Blood Pressure /Hypertension:  her blood pressure is controlled to target.  she is advised to continue her current medications including Norvasc 5 mg p.o. daily with breakfast, Clonidine 0.1 mg po daily, and Valsartan-HCT 320-25 mg po daily.  3) Lipids/Hyperlipidemia:    Review of her recent lipid panel from 03/23/22 showed controlled LDL at 91.  she is advised to continue Lipitor 40 mg daily at bedtime.  She is also advised to avoid fried foods and butter.  Side effects and precautions discussed with her.    4)  Weight/Diet:  her Body mass index is 27.55 kg/m.  -  clearly complicating her diabetes care.   she is a candidate for weight loss. I discussed with her the fact that loss of 5 - 10% of her  current body weight will have the most impact on her diabetes management.  Exercise, and detailed carbohydrates information provided  -  detailed on discharge instructions.  5) Chronic Care/Health Maintenance: -she is on ACEI/ARB  and Statin medications and is encouraged to initiate and continue to follow up with Ophthalmology, Dentist, Podiatrist at least yearly or according to recommendations, and advised to stay away from smoking. I have  recommended yearly flu vaccine and pneumonia vaccine at least every 5 years; moderate intensity exercise for up to 150 minutes weekly; and sleep for at least 7 hours a day.  - she is advised to maintain close follow up with Martha Robert, MD for primary care needs, as well as her other providers for optimal and coordinated care.      I spent  46  minutes in the care of the patient today including review of labs from CMP, Lipids, Thyroid Function, Hematology (current and previous including abstractions from other facilities); face-to-face time discussing  her blood glucose readings/logs, discussing hypoglycemia and hyperglycemia episodes and symptoms, medications doses, her options of short and long term treatment based on the latest standards of care / guidelines;  discussion about incorporating lifestyle medicine;  and documenting the encounter. Risk reduction counseling performed per USPSTF guidelines to reduce obesity and cardiovascular risk factors.     Please refer to Patient Instructions for Blood Glucose Monitoring and Insulin/Medications Dosing Guide"  in media tab for additional information. Please  also refer to " Patient Self Inventory" in the Media  tab for reviewed elements of pertinent patient history.  Brooke Bonito participated in the discussions, expressed understanding, and voiced agreement with the above plans.  All questions were answered to her satisfaction. she is encouraged to contact clinic should she have any questions or concerns prior to her return visit.     Follow up plan: - Return in about 3 months (around 07/17/2023) for Diabetes F/U with A1c in office, Previsit labs, Bring meter and logs.  Ronny Bacon, [facility] [address] [place], [place] [address] Phone: [phone] Fax: [phone]  04/18/2023, 3:13 PM

## 2023-04-22 ENCOUNTER — Ambulatory Visit (HOSPITAL_COMMUNITY)
Admission: RE | Admit: 2023-04-22 | Discharge: 2023-04-22 | Disposition: A | Discharge status: Home / Self Care | Payer: 59 | Source: Ambulatory Visit | Attending: Internal Medicine | Admitting: Internal Medicine

## 2023-04-22 DIAGNOSIS — E119 Type 2 diabetes mellitus without complications: Secondary | ICD-10-CM | POA: Diagnosis present

## 2023-04-22 DIAGNOSIS — I739 Peripheral vascular disease, unspecified: Secondary | ICD-10-CM | POA: Diagnosis present

## 2023-06-17 ENCOUNTER — Other Ambulatory Visit (HOSPITAL_COMMUNITY): Payer: Self-pay

## 2023-06-17 ENCOUNTER — Telehealth: Payer: Self-pay | Admitting: Pharmacy Technician

## 2023-06-17 NOTE — Telephone Encounter (Signed)
 Pharmacy Patient Advocate Encounter   Received notification from CoverMyMeds that prior authorization for Insulin Degludec FlexTouch 100UNIT/ML pen-injectors is required/requested.   Insurance verification completed.   The patient is insured through [facility] .   Per test claim:  BRAND NAME Evaristo Bury is preferred by the insurance.  If suggested medication is appropriate, Please send in a new RX and discontinue this one. If not, please advise as to why it's not appropriate so that we may request a Prior Authorization. Please note, some preferred medications may still require a PA.  If the suggested medications have not been trialed and there are no contraindications to their use, the PA will not be submitted, as it will not be approved.   **Called and spoke to pharmacy and they received a $0.00 paid claim.**

## 2023-07-19 ENCOUNTER — Ambulatory Visit: Payer: 59 | Admitting: Nurse Practitioner

## 2023-09-05 ENCOUNTER — Other Ambulatory Visit: Payer: Self-pay | Admitting: Nurse Practitioner

## 2023-10-05 LAB — COMPREHENSIVE METABOLIC PANEL WITH GFR
ALT: 7 IU/L (ref 0–32)
AST: 10 IU/L (ref 0–40)
Albumin: 4.1 g/dL (ref 3.9–4.9)
Alkaline Phosphatase: 115 IU/L (ref 44–121)
BUN/Creatinine Ratio: 20 (ref 12–28)
BUN: 24 mg/dL (ref 8–27)
Bilirubin Total: 0.4 mg/dL (ref 0.0–1.2)
CO2: 22 mmol/L (ref 20–29)
Calcium: 9.6 mg/dL (ref 8.7–10.3)
Chloride: 98 mmol/L (ref 96–106)
Creatinine, Ser: 1.2 mg/dL — ABNORMAL HIGH (ref 0.57–1.00)
Globulin, Total: 2.9 g/dL (ref 1.5–4.5)
Glucose: 359 mg/dL — ABNORMAL HIGH (ref 70–99)
Potassium: 3.8 mmol/L (ref 3.5–5.2)
Sodium: 139 mmol/L (ref 134–144)
Total Protein: 7 g/dL (ref 6.0–8.5)
eGFR: 49 mL/min/1.73 — ABNORMAL LOW (ref >59)

## 2023-10-13 ENCOUNTER — Ambulatory Visit (INDEPENDENT_AMBULATORY_CARE_PROVIDER_SITE_OTHER): Admitting: Nurse Practitioner

## 2023-10-13 ENCOUNTER — Encounter: Payer: Self-pay | Admitting: Nurse Practitioner

## 2023-10-13 VITALS — BP 132/80 | HR 66 | Ht 73.0 in | Wt 229.2 lb

## 2023-10-13 DIAGNOSIS — E782 Mixed hyperlipidemia: Secondary | ICD-10-CM

## 2023-10-13 DIAGNOSIS — E1165 Type 2 diabetes mellitus with hyperglycemia: Secondary | ICD-10-CM

## 2023-10-13 DIAGNOSIS — I10 Essential (primary) hypertension: Secondary | ICD-10-CM

## 2023-10-13 DIAGNOSIS — Z7984 Long term (current) use of oral hypoglycemic drugs: Secondary | ICD-10-CM

## 2023-10-13 DIAGNOSIS — Z91199 Patient's noncompliance with other medical treatment and regimen due to unspecified reason: Secondary | ICD-10-CM | POA: Diagnosis not present

## 2023-10-13 DIAGNOSIS — Z794 Long term (current) use of insulin: Secondary | ICD-10-CM | POA: Diagnosis not present

## 2023-10-13 MED ORDER — GLIPIZIDE 10 MG PO TABS: 10.0000 mg | ORAL_TABLET | Freq: Two times a day (BID) | ORAL | 1 refills | Status: DC

## 2023-10-13 MED ORDER — EMPAGLIFLOZIN 10 MG PO TABS: 10.0000 mg | ORAL_TABLET | Freq: Every day | ORAL | 1 refills | Status: DC

## 2023-10-13 MED ORDER — TRESIBA FLEXTOUCH 100 UNIT/ML ~~LOC~~ SOPN: 20.0000 [IU] | PEN_INJECTOR | Freq: Every day | SUBCUTANEOUS | 3 refills | Status: DC

## 2023-10-13 MED ORDER — METFORMIN HCL 1000 MG PO TABS: 500.0000 mg | ORAL_TABLET | Freq: Two times a day (BID) | ORAL | 1 refills | Status: DC

## 2023-10-13 NOTE — Progress Notes (Signed)
 Endocrinology Follow Up Note       10/13/2023, 2:46 PM   Subjective:    Patient ID: Martha Jennings, female    DOB: September 16, 1955. (Age 68)  Edona Schreffler is being seen in follow up after being seen in consultation for management of currently uncontrolled symptomatic diabetes requested by  Pecolia Senior, MD.   Past Medical History:  Diagnosis Date   Asthma    Diabetes mellitus without complication (HCC)    GERD (gastroesophageal reflux disease)    Hyperlipidemia     Past Surgical History:  Procedure Laterality Date   ABDOMINAL HYSTERECTOMY      Social History   Socioeconomic History   Marital status: Single    Spouse name: Not on file   Number of children: Not on file   Years of education: Not on file   Highest education level: Not on file  Occupational History   Not on file  Tobacco Use   Smoking status: Never   Smokeless tobacco: Never  Vaping Use   Vaping status: Never Used  Substance and Sexual Activity   Alcohol use: No   Drug use: No   Sexual activity: Not on file  Other Topics Concern   Not on file  Social History Narrative   Not on file   Social Drivers of Health   Financial Resource Strain: Not on file  Food Insecurity: Not on file  Transportation Needs: Not on file  Physical Activity: Not on file  Stress: Not on file  Social Connections: Not on file    Family History  Problem Relation Age of Onset   Cancer Mother     Outpatient Encounter Medications as of 10/13/2023  Medication Sig   amLODipine (NORVASC) 5 MG tablet Take 5 mg by mouth daily.   atorvastatin (LIPITOR) 40 MG tablet Take 40 mg by mouth daily at 6 PM.    B-D ULTRAFINE III SHORT PEN 31G X 8 MM MISC SMARTSIG:1 Each SUB-Q Daily   cloNIDine (CATAPRES) 0.1 MG tablet Take 0.1 mg by mouth daily.    glucose blood (TRUE METRIX BLOOD GLUCOSE TEST) test strip CHECK BLOOD SUGAR ONCE DAILY   omeprazole (PRILOSEC) 20 MG capsule Take 20  mg by mouth daily.    valsartan-hydrochlorothiazide (DIOVAN-HCT) 320-25 MG tablet Take 1 tablet by mouth daily.    VENTOLIN HFA 108 (90 Base) MCG/ACT inhaler Inhale 2 puffs into the lungs every 4 (four) hours as needed.   [DISCONTINUED] empagliflozin  (JARDIANCE ) 10 MG TABS tablet Take 10 mg by mouth daily.   [DISCONTINUED] glipiZIDE  (GLUCOTROL ) 10 MG tablet Take 1 tablet (10 mg total) by mouth 2 (two) times daily before a meal.   [DISCONTINUED] metFORMIN  (GLUCOPHAGE ) 1000 MG tablet Take 1 tablet (1,000 mg total) by mouth 2 (two) times daily with a meal.   [DISCONTINUED] TRESIBA  FLEXTOUCH 100 UNIT/ML FlexTouch Pen INJECT 20 UNITS SUBCUTANEOUSLY AT BEDTIME (Patient taking differently: 20 Units at bedtime.)   empagliflozin  (JARDIANCE ) 10 MG TABS tablet Take 1 tablet (10 mg total) by mouth daily.   glipiZIDE  (GLUCOTROL ) 10 MG tablet Take 1 tablet (10 mg total) by mouth 2 (two) times daily before a meal.   insulin degludec  (TRESIBA  FLEXTOUCH) 100 UNIT/ML FlexTouch  Pen Inject 20 Units into the skin at bedtime.   metFORMIN  (GLUCOPHAGE ) 1000 MG tablet Take 0.5 tablets (500 mg total) by mouth 2 (two) times daily with a meal.   No facility-administered encounter medications on file as of 10/13/2023.    ALLERGIES: No Known Allergies  VACCINATION STATUS: Immunization History  Administered Date(s) Administered   Moderna Sars-Covid-2 Vaccination 06/11/2019    Diabetes She presents for her follow-up diabetic visit. She has type 2 diabetes mellitus. Onset time: Diagnosed at approx age of 30. Her disease course has been stable. There are no hypoglycemic associated symptoms. Associated symptoms include fatigue, foot paresthesias, polydipsia and polyuria. There are no hypoglycemic complications. Symptoms are stable. Diabetic complications include nephropathy and peripheral neuropathy. Risk factors for coronary artery disease include diabetes mellitus, dyslipidemia, family history, hypertension, obesity and  sedentary lifestyle. Current diabetic treatment includes insulin injections and oral agent (triple therapy). She is compliant with treatment some of the time. Her weight is fluctuating minimally. She is following a generally unhealthy diet. When asked about meal planning, she reported none. She has not had a previous visit with a dietitian. She never participates in exercise. Her overall blood glucose range is >200 mg/dl. (She presents today, accompanied by her sister, with her meter showing inconsistent glucose monitoring and gross hyperglycemia overall.  Her most recent A1c on 6/16 was 9.8%, improving from last visit of 10.8%.  Her sister notes she is eating and drinking whatever she wants, and is also not taking her medication routinely.  She lives alone.  She uses pill packs for her medication (however she still doesn't take them routinely). Analysis of her meter shows 7-day average of 368, 14-day average of 368, 30-day average of 360.) An ACE inhibitor/angiotensin II receptor blocker is being taken. She does not see a podiatrist.Eye exam is not current.    Review of systems  Constitutional: + fluctuating body weight,  current Body mass index is 30.24 kg/m. , no fatigue, no subjective hyperthermia, no subjective hypothermia Eyes: no blurry vision, no xerophthalmia ENT: no sore throat, no nodules palpated in throat, no dysphagia/odynophagia, no hoarseness Cardiovascular: no chest pain, no shortness of breath, no palpitations, no leg swelling Respiratory: no cough, no shortness of breath Gastrointestinal: no nausea/vomiting/diarrhea Musculoskeletal: no muscle/joint aches Skin: no rashes, no hyperemia Neurological: no tremors, no numbness, no tingling, no dizziness Psychiatric: no depression, no anxiety  Objective:     BP 132/80 (BP Location: Right Arm, Patient Position: Sitting, Cuff Size: Large)   Pulse 66   Ht 6' 1 (1.854 m)   Wt 229 lb 3.2 oz (104 kg)   BMI 30.24 kg/m   Wt Readings  from Last 3 Encounters:  10/13/23 229 lb 3.2 oz (104 kg)  04/18/23 208 lb 12.8 oz (94.7 kg)  03/31/22 214 lb 6.4 oz (97.3 kg)     BP Readings from Last 3 Encounters:  10/13/23 132/80  04/18/23 119/78  03/31/22 138/80     Physical Exam- Limited  Constitutional:  Body mass index is 30.24 kg/m. , not in acute distress, unconcerned affect, avoids eye contact, ? Cognitive impairment Eyes:  EOMI, no exophthalmos Musculoskeletal: no gross deformities, strength intact in all four extremities, no gross restriction of joint movements Skin:  no rashes, no hyperemia Neurological: no tremor with outstretched hands   Diabetic Foot Exam - Simple   No data filed     CMP ( most recent) CMP     Component Value Date/Time   NA 139 10/04/2023 0908  K 3.8 10/04/2023 0908   CL 98 10/04/2023 0908   CO2 22 10/04/2023 0908   GLUCOSE 359 (H) 10/04/2023 0908   GLUCOSE 222 (H) 09/01/2021 0956   BUN 24 10/04/2023 0908   CREATININE 1.20 (H) 10/04/2023 0908   CREATININE 1.09 (H) 09/01/2021 0956   CALCIUM 9.6 10/04/2023 0908   PROT 7.0 10/04/2023 0908   ALBUMIN 4.1 10/04/2023 0908   AST 10 10/04/2023 0908   ALT 7 10/04/2023 0908   ALKPHOS 115 10/04/2023 0908   BILITOT 0.4 10/04/2023 0908   GFRNONAA 34 (L) 04/11/2017 2031   GFRAA 40 (L) 04/11/2017 2031     Diabetic Labs (most recent): Lab Results  Component Value Date   HGBA1C 10.8 04/14/2023   HGBA1C 10.8 (A) 03/31/2022   HGBA1C 10.2 (A) 12/10/2021   MICROALBUR 30 12/10/2021   MICROALBUR 30 12/03/2020     Lipid Panel ( most recent) Lipid Panel     Component Value Date/Time   CHOL 169 03/23/2022 0943   TRIG 143 03/23/2022 0943   HDL 53 03/23/2022 0943   CHOLHDL 3.2 03/23/2022 0943   CHOLHDL 2.9 02/25/2021 0852   LDLCALC 91 03/23/2022 0943   LDLCALC 89 02/25/2021 0852   LABVLDL 25 03/23/2022 0943      Lab Results  Component Value Date   TSH 2.300 03/23/2022   TSH 2.50 11/05/2020   FREET4 1.26 03/23/2022   FREET4 1.1  11/05/2020           Assessment & Plan:   1) Uncontrolled Type 2 Diabetes with hyperglycemia:  She presents today, accompanied by her sister, with her meter showing inconsistent glucose monitoring and gross hyperglycemia overall.  Her most recent A1c on 6/16 was 9.8%, improving from last visit of 10.8%.  Her sister notes she is eating and drinking whatever she wants, and is also not taking her medication routinely.  She lives alone.  She uses pill packs for her medication (however she still doesn't take them routinely). Analysis of her meter shows 7-day average of 368, 14-day average of 368, 30-day average of 360.  - Courteney Alderete has currently uncontrolled symptomatic type 2 DM since 68 years of age.  -Recent labs reviewed.  - I had a long discussion with her about the progressive nature of diabetes and the pathology behind its complications. -her diabetes is complicated by CKD stage 3a and she remains at a high risk for more acute and chronic complications which include CAD, CVA, CKD, retinopathy, and neuropathy. These are all discussed in detail with her.  - Nutritional counseling repeated at each appointment due to patients tendency to fall back in to old habits.  - The patient admits there is a room for improvement in their diet and drink choices. -  Suggestion is made for the patient to avoid simple carbohydrates from their diet including Cakes, Sweet Desserts / Pastries, Ice Cream, Soda (diet and regular), Sweet Tea, Candies, Chips, Cookies, Sweet Pastries, Store Bought Juices, Alcohol in Excess of 1-2 drinks a day, Artificial Sweeteners, Coffee Creamer, and Sugar-free Products. This will help patient to have stable blood glucose profile and potentially avoid unintended weight gain.   - I encouraged the patient to switch to unprocessed or minimally processed complex starch and increased protein intake (animal or plant source), fruits, and vegetables.   - Patient is advised to  stick to a routine mealtimes to eat 3 meals a day and avoid unnecessary snacks (to snack only to correct hypoglycemia).  - I have approached  her with the following individualized plan to manage her diabetes and patient agrees:   -Her diabetes management is complicated by suspected noncompliance with dietary regimen and insulin injections.  -She is advised to consistently take her medications including Tresiba  20 units SQ nightly, Glipizide  10 mg po twice daily with meals, and Jardiance  10 mg po daily.  I did lower her Metformin  to 500 mg po twice daily due to worsening renal impairment.  -We discussed the possibility of needing prandial insulin to help manage her diabetes but given her limited support, will try to avoid it at this time for safety reasons.  -she is encouraged to continue monitoring blood glucose at least twice daily, before breakfast and before bed, and to call the clinic if she has readings less than 70 or greater than 300 for 3 tests in a row.  She did not care for CGM, tried it in the past.  - she is warned not to take insulin without proper monitoring per orders. - Adjustment parameters are given to her for hypo and hyperglycemia in writing.  - Specific targets for  A1c; LDL, HDL, and Triglycerides were discussed with the patient.  2) Blood Pressure /Hypertension:  her blood pressure is controlled to target.  she is advised to continue her current medications as prescribed by her PCP.  3) Lipids/Hyperlipidemia:    Review of her recent lipid panel from 09/07/23 showed controlled LDL at 70.  she is advised to continue Lipitor 40 mg daily at bedtime.  She is also advised to avoid fried foods and butter.  Side effects and precautions discussed with her.    4)  Weight/Diet:  her Body mass index is 30.24 kg/m.  -  clearly complicating her diabetes care.   she is a candidate for weight loss. I discussed with her the fact that loss of 5 - 10% of her  current body weight will have  the most impact on her diabetes management.  Exercise, and detailed carbohydrates information provided  -  detailed on discharge instructions.  5) Chronic Care/Health Maintenance: -she is on ACEI/ARB and Statin medications and is encouraged to initiate and continue to follow up with Ophthalmology, Dentist, Podiatrist at least yearly or according to recommendations, and advised to stay away from smoking. I have recommended yearly flu vaccine and pneumonia vaccine at least every 5 years; moderate intensity exercise for up to 150 minutes weekly; and sleep for at least 7 hours a day.  - she is advised to maintain close follow up with Pecolia Senior, MD for primary care needs, as well as her other providers for optimal and coordinated care.     I spent  46  minutes in the care of the patient today including review of labs from CMP, Lipids, Thyroid Function, Hematology (current and previous including abstractions from other facilities); face-to-face time discussing  her blood glucose readings/logs, discussing hypoglycemia and hyperglycemia episodes and symptoms, medications doses, her options of short and long term treatment based on the latest standards of care / guidelines;  discussion about incorporating lifestyle medicine;  and documenting the encounter. Risk reduction counseling performed per USPSTF guidelines to reduce obesity and cardiovascular risk factors.     Please refer to Patient Instructions for Blood Glucose Monitoring and Insulin/Medications Dosing Guide  in media tab for additional information. Please  also refer to  Patient Self Inventory in the Media  tab for reviewed elements of pertinent patient history.  Adrien Pacini participated in the discussions, expressed understanding, and  voiced agreement with the above plans.  All questions were answered to her satisfaction. she is encouraged to contact clinic should she have any questions or concerns prior to her return visit.     Follow  up plan: - Return in about 3 months (around 01/13/2024) for Diabetes F/U with A1c in office, Bring meter and logs, No previsit labs.  Benton Rio, [facility] [address] [place], [place] [address] Phone: [phone] Fax: [phone]  10/13/2023, 2:46 PM

## 2023-10-18 ENCOUNTER — Other Ambulatory Visit: Payer: Self-pay | Admitting: *Deleted

## 2023-10-18 DIAGNOSIS — Z794 Long term (current) use of insulin: Secondary | ICD-10-CM

## 2023-10-18 DIAGNOSIS — Z7984 Long term (current) use of oral hypoglycemic drugs: Secondary | ICD-10-CM

## 2023-10-18 DIAGNOSIS — E1165 Type 2 diabetes mellitus with hyperglycemia: Secondary | ICD-10-CM

## 2023-10-18 MED ORDER — TRUE METRIX BLOOD GLUCOSE TEST VI STRP: ORAL_STRIP | 2 refills | Status: DC

## 2023-12-28 ENCOUNTER — Other Ambulatory Visit (HOSPITAL_COMMUNITY): Payer: Self-pay | Admitting: Internal Medicine

## 2023-12-28 DIAGNOSIS — Z1231 Encounter for screening mammogram for malignant neoplasm of breast: Secondary | ICD-10-CM

## 2024-01-13 ENCOUNTER — Other Ambulatory Visit: Payer: Self-pay | Admitting: *Deleted

## 2024-01-13 DIAGNOSIS — D1722 Benign lipomatous neoplasm of skin and subcutaneous tissue of left arm: Secondary | ICD-10-CM

## 2024-01-17 ENCOUNTER — Ambulatory Visit: Admitting: General Surgery

## 2024-01-18 ENCOUNTER — Encounter (HOSPITAL_COMMUNITY): Payer: Self-pay

## 2024-01-18 ENCOUNTER — Ambulatory Visit (HOSPITAL_COMMUNITY)
Admission: RE | Admit: 2024-01-18 | Discharge: 2024-01-18 | Disposition: A | Discharge status: Home / Self Care | Source: Ambulatory Visit | Attending: Internal Medicine | Admitting: Internal Medicine

## 2024-01-18 DIAGNOSIS — Z1231 Encounter for screening mammogram for malignant neoplasm of breast: Secondary | ICD-10-CM | POA: Insufficient documentation

## 2024-01-19 ENCOUNTER — Ambulatory Visit: Admitting: Nurse Practitioner

## 2024-01-24 ENCOUNTER — Ambulatory Visit (INDEPENDENT_AMBULATORY_CARE_PROVIDER_SITE_OTHER): Admitting: Nurse Practitioner

## 2024-01-24 ENCOUNTER — Encounter: Payer: Self-pay | Admitting: Nurse Practitioner

## 2024-01-24 VITALS — BP 116/80 | HR 96 | Ht 73.0 in | Wt 213.4 lb

## 2024-01-24 DIAGNOSIS — Z794 Long term (current) use of insulin: Secondary | ICD-10-CM | POA: Diagnosis not present

## 2024-01-24 DIAGNOSIS — I10 Essential (primary) hypertension: Secondary | ICD-10-CM | POA: Diagnosis not present

## 2024-01-24 DIAGNOSIS — E782 Mixed hyperlipidemia: Secondary | ICD-10-CM

## 2024-01-24 DIAGNOSIS — E1165 Type 2 diabetes mellitus with hyperglycemia: Secondary | ICD-10-CM

## 2024-01-24 DIAGNOSIS — Z7984 Long term (current) use of oral hypoglycemic drugs: Secondary | ICD-10-CM

## 2024-01-24 DIAGNOSIS — Z91199 Patient's noncompliance with other medical treatment and regimen due to unspecified reason: Secondary | ICD-10-CM

## 2024-01-24 DIAGNOSIS — N1831 Chronic kidney disease, stage 3a: Secondary | ICD-10-CM

## 2024-01-24 LAB — POCT GLYCOSYLATED HEMOGLOBIN (HGB A1C): Hemoglobin A1C: 11.7 % — AB (ref 4.0–5.6)

## 2024-01-24 MED ORDER — TRESIBA FLEXTOUCH 100 UNIT/ML ~~LOC~~ SOPN: 20.0000 [IU] | PEN_INJECTOR | Freq: Every day | SUBCUTANEOUS | 3 refills | Status: AC

## 2024-01-24 MED ORDER — TRUE METRIX BLOOD GLUCOSE TEST VI STRP: ORAL_STRIP | 2 refills | Status: AC

## 2024-01-24 MED ORDER — GLIPIZIDE 10 MG PO TABS: 10.0000 mg | ORAL_TABLET | Freq: Two times a day (BID) | ORAL | 1 refills | Status: AC

## 2024-01-24 MED ORDER — METFORMIN HCL 500 MG PO TABS: 500.0000 mg | ORAL_TABLET | Freq: Two times a day (BID) | ORAL | 1 refills | Status: AC

## 2024-01-24 MED ORDER — EMPAGLIFLOZIN 10 MG PO TABS: 10.0000 mg | ORAL_TABLET | Freq: Every day | ORAL | 1 refills | Status: AC

## 2024-01-24 NOTE — Progress Notes (Signed)
 Endocrinology Follow Up Note       01/24/2024, 9:00 AM   Subjective:    Patient ID: Martha Jennings, female    DOB: 03-29-1955.  Martha Jennings is being seen in follow up after being seen in consultation for management of currently uncontrolled symptomatic diabetes requested by  Pecolia Senior, MD.   Past Medical History:  Diagnosis Date   Asthma    Diabetes mellitus without complication (HCC)    GERD (gastroesophageal reflux disease)    Hyperlipidemia     Past Surgical History:  Procedure Laterality Date   ABDOMINAL HYSTERECTOMY      Social History   Socioeconomic History   Marital status: Single    Spouse name: Not on file   Number of children: Not on file   Years of education: Not on file   Highest education level: Not on file  Occupational History   Not on file  Tobacco Use   Smoking status: Never   Smokeless tobacco: Never  Vaping Use   Vaping status: Never Used  Substance and Sexual Activity   Alcohol use: No   Drug use: No   Sexual activity: Not on file  Other Topics Concern   Not on file  Social History Narrative   Not on file   Social Drivers of Health   Financial Resource Strain: Not on file  Food Insecurity: Not on file  Transportation Needs: Not on file  Physical Activity: Not on file  Stress: Not on file  Social Connections: Not on file    Family History  Problem Relation Age of Onset   Cancer Mother     Outpatient Encounter Medications as of 01/24/2024  Medication Sig   amLODipine (NORVASC) 5 MG tablet Take 5 mg by mouth daily.   atorvastatin (LIPITOR) 40 MG tablet Take 40 mg by mouth daily at 6 PM.    B-D ULTRAFINE III SHORT PEN 31G X 8 MM MISC SMARTSIG:1 Each SUB-Q Daily   cloNIDine (CATAPRES) 0.1 MG tablet Take 0.1 mg by mouth daily.    omeprazole (PRILOSEC) 20 MG capsule Take 20 mg by mouth daily.    valsartan-hydrochlorothiazide (DIOVAN-HCT) 320-25 MG tablet Take 1  tablet by mouth daily.    VENTOLIN HFA 108 (90 Base) MCG/ACT inhaler Inhale 2 puffs into the lungs every 4 (four) hours as needed.   [DISCONTINUED] empagliflozin  (JARDIANCE ) 10 MG TABS tablet Take 1 tablet (10 mg total) by mouth daily.   [DISCONTINUED] glipiZIDE  (GLUCOTROL ) 10 MG tablet Take 1 tablet (10 mg total) by mouth 2 (two) times daily before a meal.   [DISCONTINUED] glucose blood (TRUE METRIX BLOOD GLUCOSE TEST) test strip Patient is to check blood sugars before breakfast and at bedtime, as directed by provider.   [DISCONTINUED] insulin degludec  (TRESIBA  FLEXTOUCH) 100 UNIT/ML FlexTouch Pen Inject 20 Units into the skin at bedtime.   [DISCONTINUED] metFORMIN  (GLUCOPHAGE ) 1000 MG tablet Take 0.5 tablets (500 mg total) by mouth 2 (two) times daily with a meal.   empagliflozin  (JARDIANCE ) 10 MG TABS tablet Take 1 tablet (10 mg total) by mouth daily.   glipiZIDE  (GLUCOTROL ) 10 MG tablet Take 1 tablet (10 mg total) by mouth 2 (two) times daily before  a meal.   glucose blood (TRUE METRIX BLOOD GLUCOSE TEST) test strip Patient is to check blood sugars before breakfast and at bedtime, as directed by provider.   insulin degludec  (TRESIBA  FLEXTOUCH) 100 UNIT/ML FlexTouch Pen Inject 20 Units into the skin at bedtime.   metFORMIN  (GLUCOPHAGE ) 500 MG tablet Take 1 tablet (500 mg total) by mouth 2 (two) times daily with a meal.   No facility-administered encounter medications on file as of 01/24/2024.    ALLERGIES: No Known Allergies  VACCINATION STATUS: There is no immunization history for the selected administration types on file for this patient.   Diabetes She presents for her follow-up diabetic visit. She has type 2 diabetes mellitus. Onset time: Diagnosed at approx age of 68. Her disease course has been worsening. There are no hypoglycemic associated symptoms. Associated symptoms include blurred vision, fatigue, foot paresthesias, polydipsia, polyuria and weight loss. There are no hypoglycemic  complications. Symptoms are stable. Diabetic complications include nephropathy and peripheral neuropathy. Risk factors for coronary artery disease include diabetes mellitus, dyslipidemia, family history, hypertension, obesity and sedentary lifestyle. Current diabetic treatment includes insulin injections and oral agent (triple therapy). She is compliant with treatment some of the time (admits she has not been taking her insulin). Her weight is fluctuating minimally. She is following a generally unhealthy diet. When asked about meal planning, she reported none. She has not had a previous visit with a dietitian. She never participates in exercise. Her overall blood glucose range is >200 mg/dl. (She presents today, accompanied by her sister, with her meter showing inconsistent glucose monitoring and gross hyperglycemia overall (she has only checked glucose twice in the last 30 days).  Her POCT A1c today is 11.7%, increasing from last visit of 9.8%.  She lives alone.  She uses pill packs for her medication (however she still doesn't take them routinely).  Analysis of her meter shows 7, 14, and 30-day averages were 303.  She admits she has not been taking her insulin for quite some time despite the family efforts to help her.) An ACE inhibitor/angiotensin II receptor blocker is being taken. She does not see a podiatrist.Eye exam is not current.    Review of systems  Constitutional: + decreasing body weight,  current Body mass index is 28.15 kg/m. , no fatigue, no subjective hyperthermia, no subjective hypothermia Eyes: no blurry vision, no xerophthalmia ENT: no sore throat, no nodules palpated in throat, no dysphagia/odynophagia, no hoarseness Cardiovascular: no chest pain, no shortness of breath, no palpitations, no leg swelling Respiratory: no cough, no shortness of breath Gastrointestinal: no nausea/vomiting/diarrhea Musculoskeletal: no muscle/joint aches Skin: no rashes, no hyperemia Neurological: no  tremors, no numbness, no tingling, no dizziness Psychiatric: no depression, no anxiety  Objective:     BP 116/80 (BP Location: Left Arm, Patient Position: Sitting, Cuff Size: Large)   Pulse 96   Ht 6' 1 (1.854 m)   Wt 213 lb 6.4 oz (96.8 kg)   BMI 28.15 kg/m   Wt Readings from Last 3 Encounters:  01/24/24 213 lb 6.4 oz (96.8 kg)  10/13/23 229 lb 3.2 oz (104 kg)  04/18/23 208 lb 12.8 oz (94.7 kg)     BP Readings from Last 3 Encounters:  01/24/24 116/80  10/13/23 132/80  04/18/23 119/78     Physical Exam- Limited  Constitutional:  Body mass index is 28.15 kg/m. , not in acute distress, unconcerned affect, avoids eye contact, ? Cognitive impairment Eyes:  EOMI, no exophthalmos Musculoskeletal: no gross deformities, strength intact  in all four extremities, no gross restriction of joint movements Skin:  no rashes, no hyperemia Neurological: no tremor with outstretched hands   Diabetic Foot Exam - Simple   No data filed     CMP ( most recent) CMP     Component Value Date/Time   NA 139 10/04/2023 0908   K 3.8 10/04/2023 0908   CL 98 10/04/2023 0908   CO2 22 10/04/2023 0908   GLUCOSE 359 (H) 10/04/2023 0908   GLUCOSE 222 (H) 09/01/2021 0956   BUN 24 10/04/2023 0908   CREATININE 1.20 (H) 10/04/2023 0908   CREATININE 1.09 (H) 09/01/2021 0956   CALCIUM 9.6 10/04/2023 0908   PROT 7.0 10/04/2023 0908   ALBUMIN 4.1 10/04/2023 0908   AST 10 10/04/2023 0908   ALT 7 10/04/2023 0908   ALKPHOS 115 10/04/2023 0908   BILITOT 0.4 10/04/2023 0908   GFRNONAA 34 (L) 04/11/2017 2031   GFRAA 40 (L) 04/11/2017 2031     Diabetic Labs (most recent): Lab Results  Component Value Date   HGBA1C 11.7 (A) 01/24/2024   HGBA1C 10.8 04/14/2023   HGBA1C 10.8 (A) 03/31/2022   MICROALBUR 30 12/10/2021   MICROALBUR 30 12/03/2020     Lipid Panel ( most recent) Lipid Panel     Component Value Date/Time   CHOL 169 03/23/2022 0943   TRIG 143 03/23/2022 0943   HDL 53 03/23/2022 0943    CHOLHDL 3.2 03/23/2022 0943   CHOLHDL 2.9 02/25/2021 0852   LDLCALC 91 03/23/2022 0943   LDLCALC 89 02/25/2021 0852   LABVLDL 25 03/23/2022 0943      Lab Results  Component Value Date   TSH 2.300 03/23/2022   TSH 2.50 11/05/2020   FREET4 1.26 03/23/2022   FREET4 1.1 11/05/2020           Assessment & Plan:   1) Uncontrolled Type 2 Diabetes with hyperglycemia:  She presents today, accompanied by her sister, with her meter showing inconsistent glucose monitoring and gross hyperglycemia overall (she has only checked glucose twice in the last 30 days).  Her POCT A1c today is 11.7%, increasing from last visit of 9.8%.  She lives alone.  She uses pill packs for her medication (however she still doesn't take them routinely).  Analysis of her meter shows 7, 14, and 30-day averages were 303.  She admits she has not been taking her insulin for quite some time despite the family efforts to help her.  - Martha Jennings has currently uncontrolled symptomatic type 2 DM since 68 years of age.  -Recent labs reviewed.  - I had a long discussion with her about the progressive nature of diabetes and the pathology behind its complications. -her diabetes is complicated by CKD stage 3a and she remains at a high risk for more acute and chronic complications which include CAD, CVA, CKD, retinopathy, and neuropathy. These are all discussed in detail with her.  - Nutritional counseling repeated at each appointment due to patients tendency to fall back in to old habits.  - The patient admits there is a room for improvement in their diet and drink choices. -  Suggestion is made for the patient to avoid simple carbohydrates from their diet including Cakes, Sweet Desserts / Pastries, Ice Cream, Soda (diet and regular), Sweet Tea, Candies, Chips, Cookies, Sweet Pastries, Store Bought Juices, Alcohol in Excess of 1-2 drinks a day, Artificial Sweeteners, Coffee Creamer, and Sugar-free Products. This will help  patient to have stable blood glucose profile and potentially  avoid unintended weight gain.   - I encouraged the patient to switch to unprocessed or minimally processed complex starch and increased protein intake (animal or plant source), fruits, and vegetables.   - Patient is advised to stick to a routine mealtimes to eat 3 meals a day and avoid unnecessary snacks (to snack only to correct hypoglycemia).  - I have approached her with the following individualized plan to manage her diabetes and patient agrees:   -Her diabetes management is complicated by suspected noncompliance with dietary regimen and insulin injections.  -She is advised to restart and consistently take her medications including Tresiba  20 units SQ nightly, Glipizide  10 mg po twice daily with meals, Jardiance  10 mg po daily, and Metformin  500 mg po twice daily.  I did put in referral to nephrology given decrease in kidney function (likely related to noncompliance with DM regimen).  -We discussed the possibility of needing prandial insulin to help manage her diabetes but given her limited support, will try to avoid it at this time for safety reasons.  -she is STRONGLY encouraged to start monitoring blood glucose at least twice daily, before breakfast and before bed, and to call the clinic if she has readings less than 70 or greater than 300 for 3 tests in a row.  She did not care for CGM, tried it in the past.  - she is warned not to take insulin without proper monitoring per orders. - Adjustment parameters are given to her for hypo and hyperglycemia in writing.  - Specific targets for  A1c; LDL, HDL, and Triglycerides were discussed with the patient.  2) Blood Pressure /Hypertension:  her blood pressure is controlled to target.  she is advised to continue her current medications as prescribed by her PCP.  3) Lipids/Hyperlipidemia:    Review of her recent lipid panel from 09/07/23 showed controlled LDL at 70.  she is advised  to continue Lipitor 40 mg daily at bedtime.  She is also advised to avoid fried foods and butter.  Side effects and precautions discussed with her.  Will recheck prior to next visit.  4)  Weight/Diet:  her Body mass index is 28.15 kg/m.  -  clearly complicating her diabetes care.   she is a candidate for weight loss. I discussed with her the fact that loss of 5 - 10% of her  current body weight will have the most impact on her diabetes management.  Exercise, and detailed carbohydrates information provided  -  detailed on discharge instructions.  5) Chronic Care/Health Maintenance: -she is on ACEI/ARB and Statin medications and is encouraged to initiate and continue to follow up with Ophthalmology, Dentist, Podiatrist at least yearly or according to recommendations, and advised to stay away from smoking. I have recommended yearly flu vaccine and pneumonia vaccine at least every 5 years; moderate intensity exercise for up to 150 minutes weekly; and sleep for at least 7 hours a day.  - she is advised to maintain close follow up with Pecolia Senior, MD for primary care needs, as well as her other providers for optimal and coordinated care.      I spent  31  minutes in the care of the patient today including review of labs from CMP, Lipids, Thyroid Function, Hematology (current and previous including abstractions from other facilities); face-to-face time discussing  her blood glucose readings/logs, discussing hypoglycemia and hyperglycemia episodes and symptoms, medications doses, her options of short and long term treatment based on the latest standards of care /  guidelines;  discussion about incorporating lifestyle medicine;  and documenting the encounter. Risk reduction counseling performed per USPSTF guidelines to reduce obesity and cardiovascular risk factors.     Please refer to Patient Instructions for Blood Glucose Monitoring and Insulin/Medications Dosing Guide  in media tab for additional  information. Please  also refer to  Patient Self Inventory in the Media  tab for reviewed elements of pertinent patient history.  Adrien Pacini participated in the discussions, expressed understanding, and voiced agreement with the above plans.  All questions were answered to her satisfaction. she is encouraged to contact clinic should she have any questions or concerns prior to her return visit.     Follow up plan: - Return in about 3 months (around 04/25/2024) for Diabetes F/U with A1c in office, Previsit labs, Bring meter and logs.  Benton Rio, [facility] [address] [place], [place] [address] Phone: [phone] Fax: [phone]  01/24/2024, 9:00 AM

## 2024-02-11 ENCOUNTER — Other Ambulatory Visit: Payer: Self-pay | Admitting: Nurse Practitioner

## 2024-02-28 ENCOUNTER — Encounter: Payer: Self-pay | Admitting: General Surgery

## 2024-02-28 ENCOUNTER — Ambulatory Visit: Admitting: General Surgery

## 2024-02-28 VITALS — BP 119/77 | HR 88 | Temp 98.2°F | Resp 16 | Ht 73.0 in | Wt 214.0 lb

## 2024-02-28 DIAGNOSIS — D1722 Benign lipomatous neoplasm of skin and subcutaneous tissue of left arm: Secondary | ICD-10-CM

## 2024-02-28 NOTE — Progress Notes (Signed)
 Subjective:     Martha Jennings  Patient presents back for follow-up of bilateral upper extremity lipomas.  I have seen her in the past for upper extremity lipomas.  At that time, there was no need for excision of the lipomas unless there is been a significant change in the size of the lipomas.  Patient states that she still has the lipomas but she has not noticed any one of them significantly enlarged.  She may have found a few more.  Patient does not appear to be the best historian.  She is still having trouble with controlling her hyperglycemia.  She does have an elevated A1c. Objective:    BP 119/77   Pulse 88   Temp 98.2 F (36.8 C) (Oral)   Resp 16   Ht 6' 1 (1.854 m)   Wt 214 lb (97.1 kg)   SpO2 94%   BMI 28.23 kg/m   General:  alert, cooperative, and no distress  Head is normocephalic, atraumatic Lungs are clear to auscultation with equal breath sounds bilaterally Heart examination reveals a regular rate and rhythm without S3, S4, murmurs Extremity: Patient has multiple lipomas in both upper extremities, left greater than right.  All of them are rubbery and mobile.  Only 1 in the left arm is the largest at 2.5 cm.     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  All are clinically lipomas.    Plan:   As patient has no bothersome symptoms from her lipomas and none have significantly increased in size over the past year, no need for excision.  Should she have significant increase in size or firmness of the lipoma, she was instructed to return to my care for further evaluation and treatment.  Patient does not want surgery unless absolutely necessary.

## 2024-04-25 LAB — COMPREHENSIVE METABOLIC PANEL WITH GFR
ALT: 13 [IU]/L (ref 0–32)
AST: 14 [IU]/L (ref 0–40)
Albumin: 4.2 g/dL (ref 3.9–4.9)
Alkaline Phosphatase: 79 [IU]/L (ref 49–135)
BUN/Creatinine Ratio: 13 (ref 12–28)
BUN: 15 mg/dL (ref 8–27)
Bilirubin Total: 0.8 mg/dL (ref 0.0–1.2)
CO2: 23 mmol/L (ref 20–29)
Calcium: 9.6 mg/dL (ref 8.7–10.3)
Chloride: 103 mmol/L (ref 96–106)
Creatinine, Ser: 1.13 mg/dL — ABNORMAL HIGH (ref 0.57–1.00)
Globulin, Total: 2.7 g/dL (ref 1.5–4.5)
Glucose: 180 mg/dL — ABNORMAL HIGH (ref 70–99)
Potassium: 3.6 mmol/L (ref 3.5–5.2)
Sodium: 141 mmol/L (ref 134–144)
Total Protein: 6.9 g/dL (ref 6.0–8.5)
eGFR: 53 mL/min/{1.73_m2} — ABNORMAL LOW

## 2024-04-25 LAB — LIPID PANEL
Chol/HDL Ratio: 2.2 ratio (ref 0.0–4.4)
Cholesterol, Total: 119 mg/dL (ref 100–199)
HDL: 53 mg/dL
LDL Chol Calc (NIH): 48 mg/dL (ref 0–99)
Triglycerides: 93 mg/dL (ref 0–149)
VLDL Cholesterol Cal: 18 mg/dL (ref 5–40)

## 2024-04-25 LAB — TSH: TSH: 1.74 u[IU]/mL (ref 0.450–4.500)

## 2024-04-25 LAB — VITAMIN D 25 HYDROXY (VIT D DEFICIENCY, FRACTURES): Vit D, 25-Hydroxy: 14.3 ng/mL — ABNORMAL LOW (ref 30.0–100.0)

## 2024-04-25 LAB — T4, FREE: Free T4: 1.37 ng/dL (ref 0.82–1.77)

## 2024-05-01 ENCOUNTER — Ambulatory Visit: Admitting: Nurse Practitioner
